# Patient Record
Sex: Female | Born: 1935 | Race: Black or African American | Hispanic: No | Marital: Married | Smoking: Never smoker
Health system: Southern US, Community
[De-identification: ages and names within clinical notes are randomized; demographics above are authoritative.]

## PROBLEM LIST (undated history)

## (undated) DIAGNOSIS — I10 Essential (primary) hypertension: Secondary | ICD-10-CM

## (undated) DIAGNOSIS — K5792 Diverticulitis of intestine, part unspecified, without perforation or abscess without bleeding: Secondary | ICD-10-CM

## (undated) DIAGNOSIS — C801 Malignant (primary) neoplasm, unspecified: Secondary | ICD-10-CM

## (undated) DIAGNOSIS — H409 Unspecified glaucoma: Secondary | ICD-10-CM

## (undated) DIAGNOSIS — M810 Age-related osteoporosis without current pathological fracture: Secondary | ICD-10-CM

## (undated) DIAGNOSIS — M1990 Unspecified osteoarthritis, unspecified site: Secondary | ICD-10-CM

## (undated) DIAGNOSIS — H25019 Cortical age-related cataract, unspecified eye: Secondary | ICD-10-CM

## (undated) DIAGNOSIS — T7840XA Allergy, unspecified, initial encounter: Secondary | ICD-10-CM

## (undated) HISTORY — PX: WRIST SURGERY: SHX841

## (undated) HISTORY — PX: BREAST BIOPSY: SHX20

## (undated) HISTORY — PX: BLADDER SURGERY: SHX569

## (undated) HISTORY — PX: ABDOMINAL HYSTERECTOMY: SHX81

---

## 2016-11-20 ENCOUNTER — Other Ambulatory Visit: Payer: Self-pay

## 2016-11-20 ENCOUNTER — Emergency Department
Admission: EM | Admit: 2016-11-20 | Discharge: 2016-11-20 | Disposition: A | Discharge status: Home / Self Care | Payer: Medicare Other | Attending: Emergency Medicine | Admitting: Emergency Medicine

## 2016-11-20 ENCOUNTER — Emergency Department: Payer: Medicare Other

## 2016-11-20 DIAGNOSIS — R531 Weakness: Secondary | ICD-10-CM | POA: Insufficient documentation

## 2016-11-20 DIAGNOSIS — I10 Essential (primary) hypertension: Secondary | ICD-10-CM | POA: Diagnosis not present

## 2016-11-20 DIAGNOSIS — R200 Anesthesia of skin: Secondary | ICD-10-CM | POA: Diagnosis not present

## 2016-11-20 DIAGNOSIS — N390 Urinary tract infection, site not specified: Secondary | ICD-10-CM | POA: Diagnosis not present

## 2016-11-20 DIAGNOSIS — R251 Tremor, unspecified: Secondary | ICD-10-CM | POA: Diagnosis not present

## 2016-11-20 DIAGNOSIS — Z79899 Other long term (current) drug therapy: Secondary | ICD-10-CM | POA: Diagnosis not present

## 2016-11-20 HISTORY — DX: Essential (primary) hypertension: I10

## 2016-11-20 HISTORY — DX: Unspecified osteoarthritis, unspecified site: M19.90

## 2016-11-20 HISTORY — DX: Diverticulitis of intestine, part unspecified, without perforation or abscess without bleeding: K57.92

## 2016-11-20 HISTORY — DX: Unspecified glaucoma: H40.9

## 2016-11-20 LAB — CBC
HCT: 41.8 % (ref 35.0–47.0)
HEMOGLOBIN: 13.9 g/dL (ref 12.0–16.0)
MCH: 29.9 pg (ref 26.0–34.0)
MCHC: 33.2 g/dL (ref 32.0–36.0)
MCV: 90.2 fL (ref 80.0–100.0)
Platelets: 185 10*3/uL (ref 150–440)
RBC: 4.64 MIL/uL (ref 3.80–5.20)
RDW: 14.3 % (ref 11.5–14.5)
WBC: 6.4 10*3/uL (ref 3.6–11.0)

## 2016-11-20 LAB — COMPREHENSIVE METABOLIC PANEL
ALBUMIN: 3.8 g/dL (ref 3.5–5.0)
ALT: 13 U/L — AB (ref 14–54)
ANION GAP: 10 (ref 5–15)
AST: 21 U/L (ref 15–41)
Alkaline Phosphatase: 44 U/L (ref 38–126)
BUN: 19 mg/dL (ref 6–20)
CHLORIDE: 102 mmol/L (ref 101–111)
CO2: 27 mmol/L (ref 22–32)
Calcium: 9.5 mg/dL (ref 8.9–10.3)
Creatinine, Ser: 0.76 mg/dL (ref 0.44–1.00)
GFR calc Af Amer: >60 mL/min (ref >60)
GFR calc non Af Amer: >60 mL/min (ref >60)
GLUCOSE: 99 mg/dL (ref 65–99)
Potassium: 3.6 mmol/L (ref 3.5–5.1)
SODIUM: 139 mmol/L (ref 135–145)
Total Bilirubin: 0.4 mg/dL (ref 0.3–1.2)
Total Protein: 7.5 g/dL (ref 6.5–8.1)

## 2016-11-20 LAB — URINALYSIS, COMPLETE (UACMP) WITH MICROSCOPIC
BACTERIA UA: NONE SEEN
Bilirubin Urine: NEGATIVE
Glucose, UA: NEGATIVE mg/dL
KETONES UR: NEGATIVE mg/dL
Nitrite: NEGATIVE
PROTEIN: NEGATIVE mg/dL
Specific Gravity, Urine: 1.005 (ref 1.005–1.030)
pH: 6 (ref 5.0–8.0)

## 2016-11-20 LAB — MAGNESIUM: MAGNESIUM: 1.9 mg/dL (ref 1.7–2.4)

## 2016-11-20 LAB — TROPONIN I: Troponin I: <0.03 ng/mL (ref <0.03)

## 2016-11-20 IMAGING — CT CT HEAD W/O CM
3 series · 14 of 44 positions shown, 16 images · non-contrast
Comparison: None.

CLINICAL DATA: Acute onset of left leg tremor, progressing to the
left arm.

EXAM:
CT HEAD WITHOUT CONTRAST
TECHNIQUE: Contiguous axial images were obtained from the base of the skull
through the vertex without intravenous contrast.

[Series 3: ax head wo · axial · 0.32mm/px · z∈[-87,+19]mm · 8 of 27 slices shown, 10 images]
[im 3/27  brain]
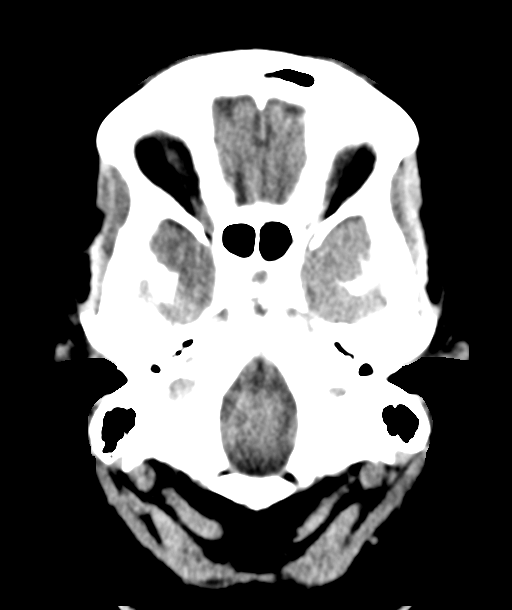
[im 3/27  bone]
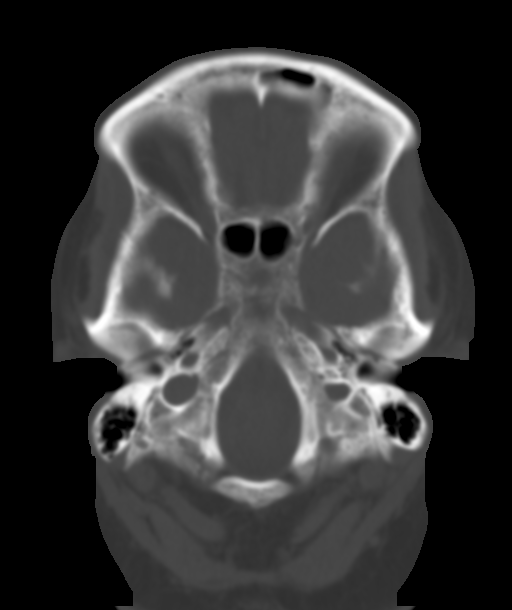
[im 6/27  brain]
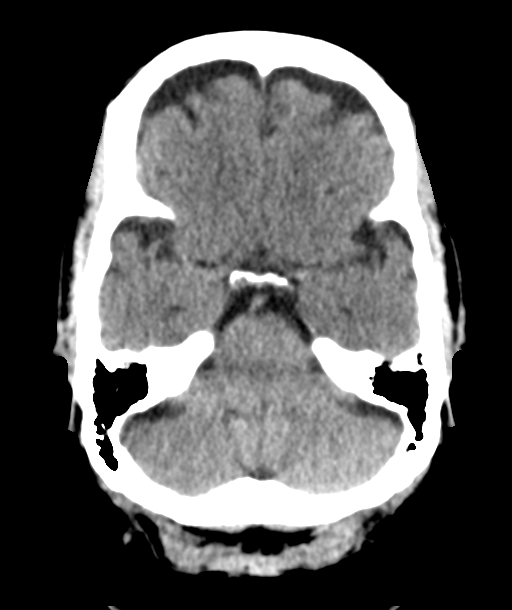
[im 9/27  brain]
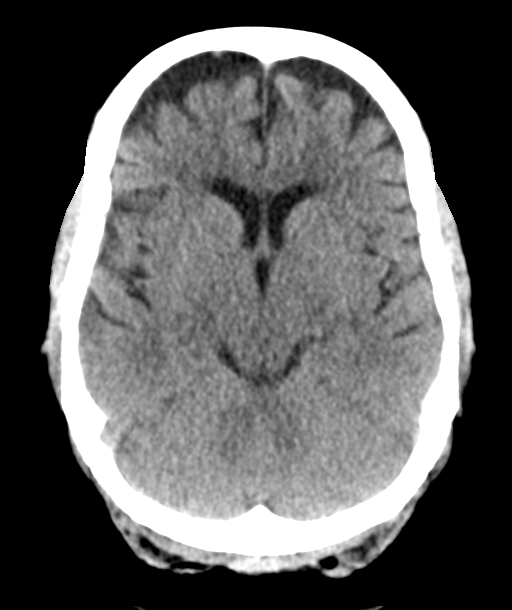
[im 12/27  brain]
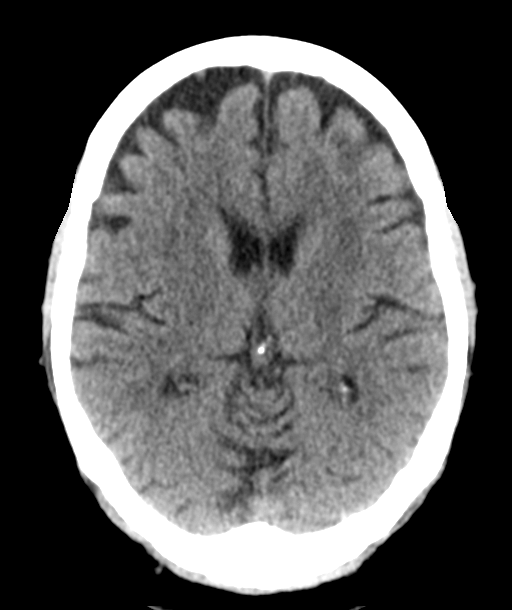
[im 16/27  brain]
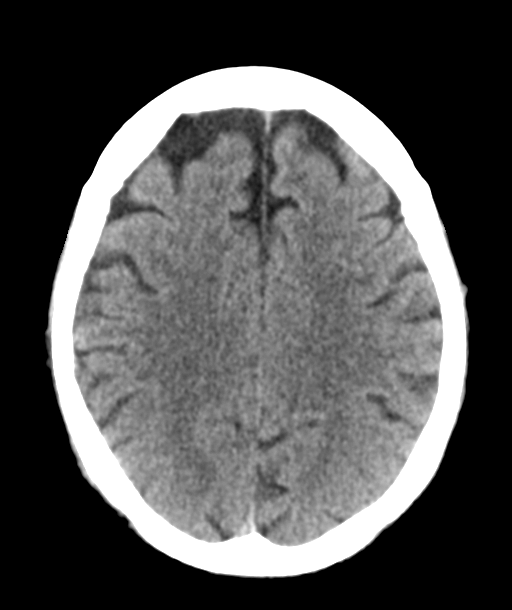
[im 16/27  bone]
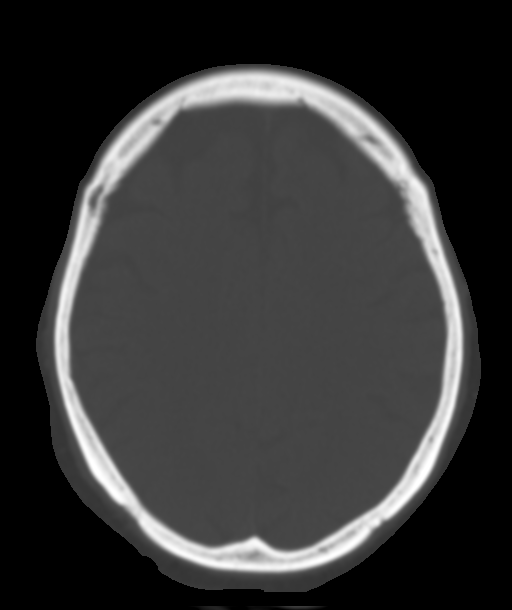
[im 19/27  brain]
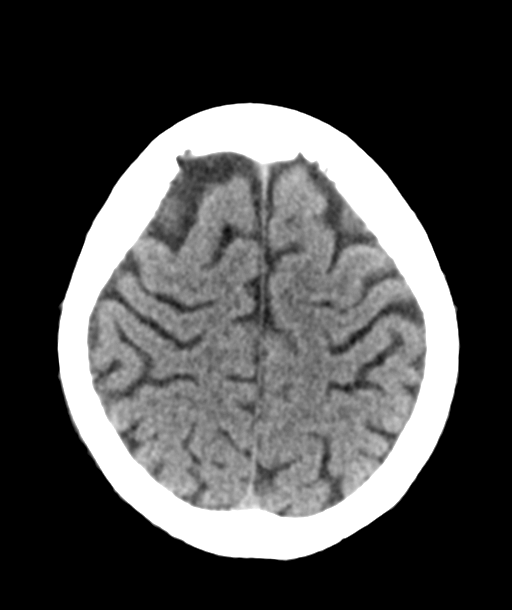
[im 22/27  brain]
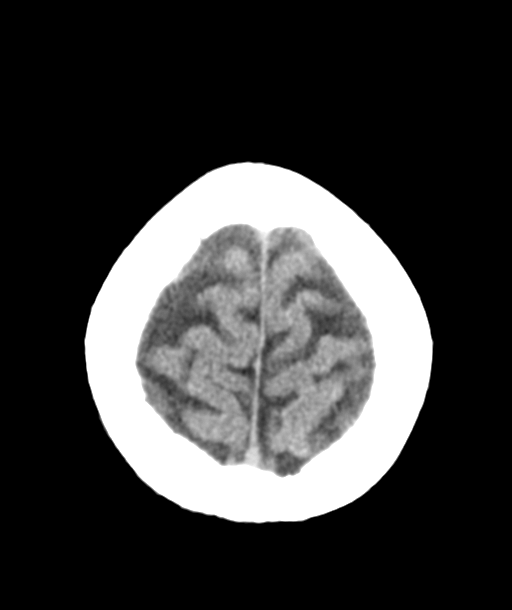
[im 25/27  brain]
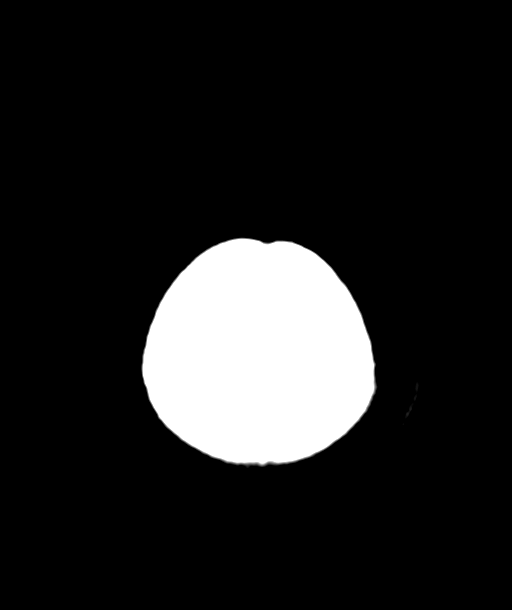

[Series 5: coronal soft tissue · coronal · 0.29mm/px · 3 of 62 slices shown]
[im 21/62  brain]
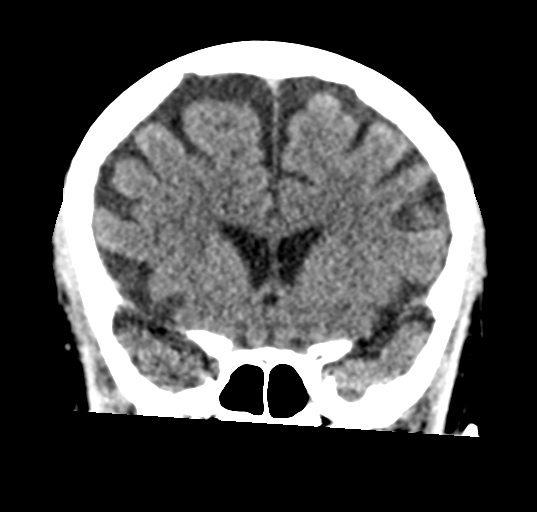
[im 28/62  brain]
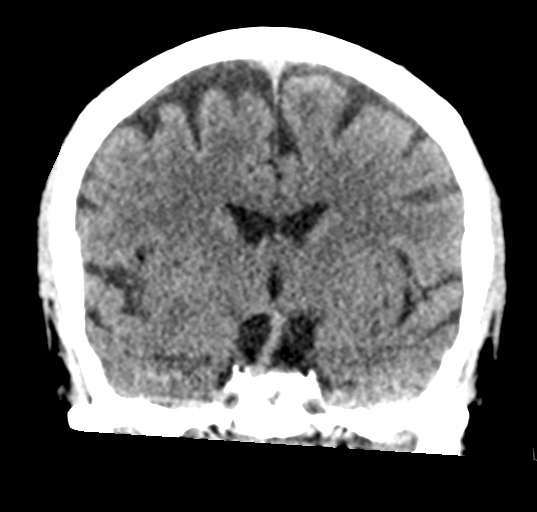
[im 34/62  brain]
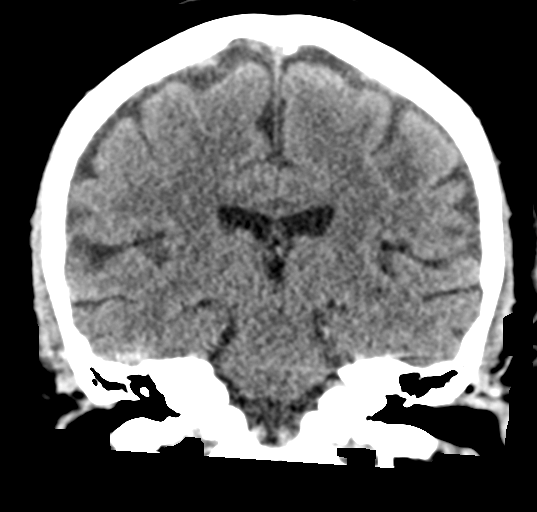

[Series 6: sagittal soft tissue · sagittal · 0.29mm/px · 3 of 49 slices shown]
[im 17/49  brain]
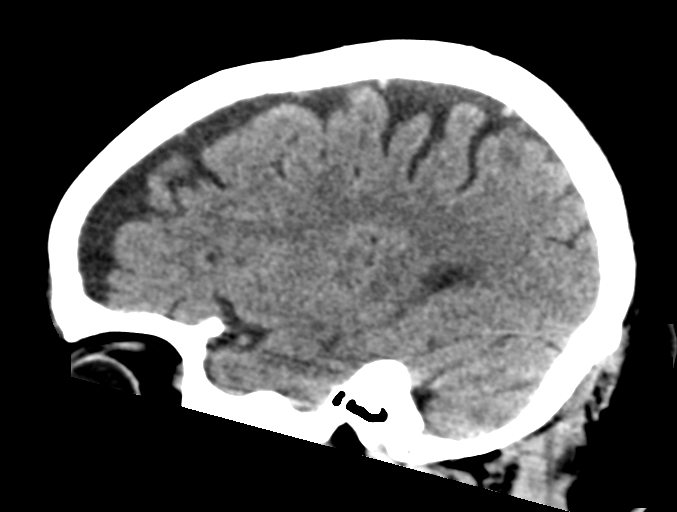
[im 25/49  brain]
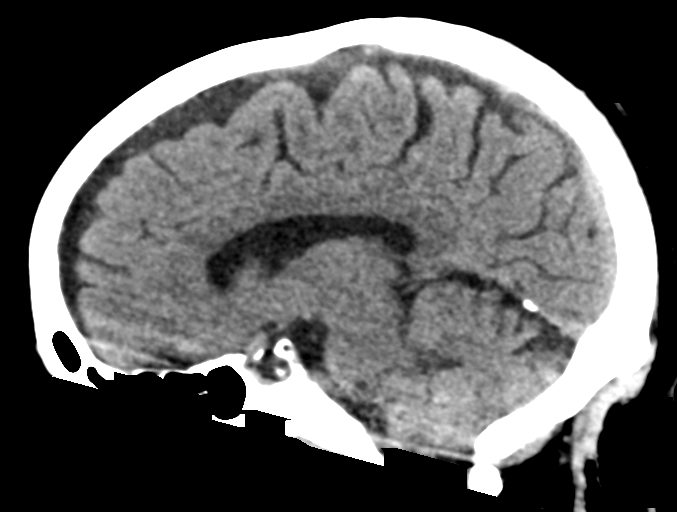
[im 33/49  brain]
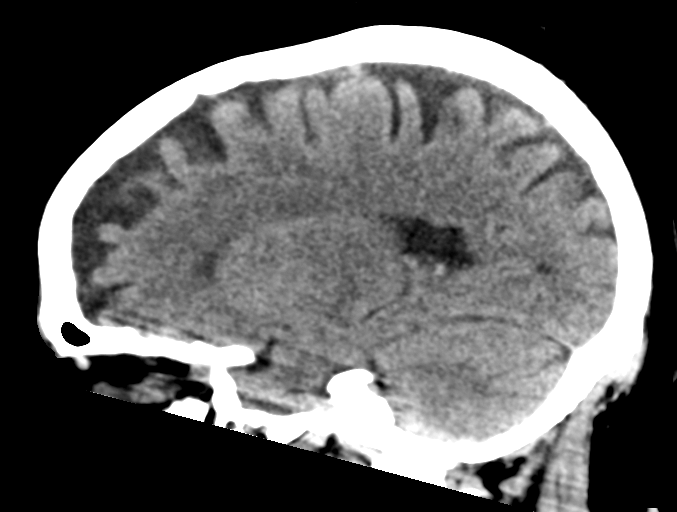

[14 of 44 positions shown; findings below may reference images not displayed]

FINDINGS: Brain: No evidence of acute infarction, hemorrhage, hydrocephalus,
extra-axial collection or mass lesion/mass effect.

Prominence of the sulci suggests mild cortical volume loss. Mild
periventricular white matter change likely reflects small vessel
ischemic microangiopathy.

The brainstem and fourth ventricle are within normal limits. The
basal ganglia are unremarkable in appearance. The cerebral
hemispheres demonstrate grossly normal gray-white differentiation.
No mass effect or midline shift is seen.

Vascular: No hyperdense vessel or unexpected calcification.

Skull: There is no evidence of fracture; visualized osseous
structures are unremarkable in appearance.

Sinuses/Orbits: The visualized portions of the orbits are within
normal limits. The paranasal sinuses and mastoid air cells are
well-aerated.

Other: No significant soft tissue abnormalities are seen.
IMPRESSION: 1. No acute intracranial pathology seen on CT.
2. Mild cortical volume loss and scattered small vessel ischemic
microangiopathy.

## 2016-11-20 MED ORDER — CEPHALEXIN 500 MG PO CAPS: 500.0000 mg | ORAL_CAPSULE | Freq: Two times a day (BID) | ORAL | 0 refills | Status: DC

## 2016-11-20 NOTE — ED Provider Notes (Signed)
[facility] Emergency Department Provider Note   ____________________________________________   First MD Initiated Contact with Patient 11/20/16 0211     (approximate)  I have reviewed the triage vital signs and the nursing notes.   HISTORY  Chief Complaint Tremors    HPI Mary Mckay is a 82 y.o. female who presents to the ED from home with a chief complaint of tremors.  Patient has a history of hypertension and diverticulitis who was in her baseline state of health approximately 10:30 PM when she experienced tremors starting in her left leg and subsequently her left arm.  Describes her tremors as nonpainful, felt like "nerve pain", and resolved when she started to move her left arm and left leg.  Describes extremity weakness, numbness/tingling, slurred speech, confusion, vision changes. Denies recent fever, chills, chest pain, shortness of breath, abdominal pain, nausea, vomiting.  Denies recent travel or trauma.   Past Medical History:  Diagnosis Date  . Arthritis   . Diverticulitis   . Glaucoma   . Hypertension     There are no active problems to display for this patient.   Past Surgical History:  Procedure Laterality Date  . ABDOMINAL HYSTERECTOMY    . BLADDER SURGERY      Prior to Admission medications   Medication Sig Start Date End Date Taking? Authorizing Provider  atenolol (TENORMIN) 100 MG tablet Take 100 mg daily by mouth.   Yes [provider]  candesartan (ATACAND) 32 MG tablet Take 32 mg daily by mouth.   Yes [provider]  fluticasone (VERAMYST) 27.5 MCG/SPRAY nasal spray Place 2 sprays daily into the nose.   Yes [provider]  hydrochlorothiazide (HYDRODIURIL) 25 MG tablet Take 25 mg daily by mouth.   Yes [provider]  linagliptin (TRADJENTA) 5 MG TABS tablet Take 5 mg daily by mouth.   Yes [provider]  NIFEdipine (PROCARDIA-XL/ADALAT CC) 30 MG 24 hr tablet Take 30 mg  daily by mouth.   Yes [provider]  traMADol (ULTRAM) 50 MG tablet Take 50 mg every 8 (eight) hours as needed by mouth.   Yes [provider]    Allergies Fentanyl  No family history on file.  Social History Social History   Tobacco Use  . Smoking status: Never Smoker  . Smokeless tobacco: Never Used  Substance Use Topics  . Alcohol use: No    Frequency: Never  . Drug use: Not on file    Review of Systems  Constitutional: No fever/chills. Eyes: No visual changes. ENT: No sore throat. Cardiovascular: Denies chest pain. Respiratory: Denies shortness of breath. Gastrointestinal: No abdominal pain.  No nausea, no vomiting.  No diarrhea.  No constipation. Genitourinary: Negative for dysuria. Musculoskeletal: Negative for back pain. Skin: Negative for rash. Neurological: Positive for left leg and left arm tremors.  Negative for headaches, focal weakness or numbness.   ____________________________________________   PHYSICAL EXAM:  VITAL SIGNS: ED Triage Vitals  Enc Vitals Group     BP 11/20/16 0138 (!) 150/64     Pulse Rate 11/20/16 0138 65     Resp 11/20/16 0138 17     Temp 11/20/16 0138 98.1 F (36.7 C)     Temp Source 11/20/16 0138 Oral     SpO2 11/20/16 0138 94 %     Weight 11/20/16 0127 203 lb (92.1 kg)     Height 11/20/16 0127 5\' 1"  (1.549 m)     Head Circumference --  Peak Flow --      Pain Score --      Pain Loc --      Pain Edu? --      Excl. in [place]? --      Constitutional: Alert and oriented. Well appearing and in no acute distress. Eyes: Conjunctivae are normal. PERRL. EOMI. Head: Atraumatic. Nose: No congestion/rhinnorhea. Mouth/Throat: Mucous membranes are moist.  Oropharynx non-erythematous. Neck: No stridor.  No carotid bruits.  Cardiovascular: Normal rate, regular rhythm. Grossly normal heart sounds.  Good peripheral circulation. Respiratory: Normal respiratory effort.  No retractions. Lungs CTAB. Gastrointestinal:  Soft and nontender. No distention. No abdominal bruits. No CVA tenderness. Musculoskeletal: No lower extremity tenderness nor edema.  No joint effusions. Neurologic:  Normal speech and language. No gross focal neurologic deficits are appreciated. MAEx4. No tremors. No pronator drift. Skin:  Skin is warm, dry and intact. No rash noted. Psychiatric: Mood and affect are normal. Speech and behavior are normal.  ____________________________________________   LABS (all labs ordered are listed, but only abnormal results are displayed)  Labs Reviewed  COMPREHENSIVE METABOLIC PANEL - Abnormal; Notable for the following components:      Result Value   ALT 13 (*)    All other components within normal limits  URINALYSIS, COMPLETE (UACMP) WITH MICROSCOPIC - Abnormal; Notable for the following components:   Color, Urine STRAW (*)    APPearance CLEAR (*)    Hgb urine dipstick SMALL (*)    Leukocytes, UA MODERATE (*)    Squamous Epithelial / LPF 0-5 (*)    All other components within normal limits  CBC  TROPONIN I  MAGNESIUM   ____________________________________________  EKG  ED ECG REPORT I, Danayah Smyre J, the attending physician, personally viewed and interpreted this ECG.   Date: 11/20/2016  EKG Time: 0226  Rate: 59  Rhythm: normal EKG, normal sinus rhythm  Axis: RAD  Intervals:none  ST&T Change: Nonspecific  ____________________________________________  RADIOLOGY  Ct Head Wo Contrast  Result Date: 11/20/2016 CLINICAL DATA:  Acute onset of left leg tremor, progressing to the left arm. EXAM: CT HEAD WITHOUT CONTRAST TECHNIQUE: Contiguous axial images were obtained from the base of the skull through the vertex without intravenous contrast. COMPARISON:  None. FINDINGS: Brain: No evidence of acute infarction, hemorrhage, hydrocephalus, extra-axial collection or mass lesion/mass effect. Prominence of the sulci suggests mild cortical volume loss. Mild periventricular white matter change  likely reflects small vessel ischemic microangiopathy. The brainstem and fourth ventricle are within normal limits. The basal ganglia are unremarkable in appearance. The cerebral hemispheres demonstrate grossly normal gray-white differentiation. No mass effect or midline shift is seen. Vascular: No hyperdense vessel or unexpected calcification. Skull: There is no evidence of fracture; visualized osseous structures are unremarkable in appearance. Sinuses/Orbits: The visualized portions of the orbits are within normal limits. The paranasal sinuses and mastoid air cells are well-aerated. Other: No significant soft tissue abnormalities are seen. IMPRESSION: 1. No acute intracranial pathology seen on CT. 2. Mild cortical volume loss and scattered small vessel ischemic microangiopathy. Electronically Signed   By: Garald Balding M.D.   On: 11/20/2016 03:01    ____________________________________________   PROCEDURES  Procedure(s) performed: None  Procedures  Critical Care performed: No  ____________________________________________   INITIAL IMPRESSION / ASSESSMENT AND PLAN / ED COURSE  As part of my medical decision making, I reviewed the following data within the [place] History obtained from family, Nursing notes reviewed and incorporated, Labs reviewed, EKG interpreted, Radiograph reviewed and  Notes from prior ED visits.   82 year old female with hypertension who presents with tremors of her left leg and left arm. Symptoms resolved by the time of this interview and examination. Differential diagnosis includes but is not limited to neurological, metabolic, cardiac causes.  Will check screening lab work, EKG, urinalysis, CT head and reassess.  Clinical Course as of Nov 21 330  Sat Nov 20, 2016  4098 Patient resting in no acute distress.  She has not had a recurrence of her tremors.  Updated her of laboratory and imaging results.  Will start antibiotic and patient will follow  up with her PCP closely early next week.  Strict return precautions given.  Patient verbalizes understanding and agrees with plan of care.  [JS]    Clinical Course User Index [JS] Paulette Blanch, MD     ____________________________________________   FINAL CLINICAL IMPRESSION(S) / ED DIAGNOSES  Final diagnoses:  Tremor  Urinary tract infection without hematuria, site unspecified     ED Discharge Orders    None       Note:  This document was prepared using Dragon voice recognition software and may include unintentional dictation errors.    Paulette Blanch, MD 11/20/16 [phone]

## 2016-11-20 NOTE — ED Triage Notes (Signed)
Patient c/o tremor beginning in left leg that has progressed left arm. Patient denies any pain/discomfort. Patient reports symptoms began at approx 2230 last night.

## 2016-11-20 NOTE — ED Notes (Signed)
Pt reports she was in bed around 2230 when she started to have uncontrolled movements on her left leg and travel up her left arm. At present pt not experiencing any symptoms reports when she walked and moved felt better. Denies any pain, talks in complete sentences.

## 2016-11-20 NOTE — Discharge Instructions (Signed)
1.  Take antibiotic as prescribed (Keflex 500 mg twice daily x 7 days). 2.  Return to the ER for worsening symptoms, persistent vomiting, difficulty breathing or other concerns.

## 2017-03-24 ENCOUNTER — Encounter: Payer: Self-pay | Admitting: *Deleted

## 2017-03-25 ENCOUNTER — Ambulatory Visit: Payer: Medicare Other | Admitting: Certified Registered"

## 2017-03-25 ENCOUNTER — Encounter
Admission: RE | Disposition: A | Discharge status: Home / Self Care | Payer: Self-pay | Source: Ambulatory Visit | Attending: Gastroenterology

## 2017-03-25 ENCOUNTER — Ambulatory Visit
Admission: RE | Admit: 2017-03-25 | Discharge: 2017-03-25 | Disposition: A | Discharge status: Home / Self Care | Payer: Medicare Other | Source: Ambulatory Visit | Attending: Gastroenterology | Admitting: Gastroenterology

## 2017-03-25 ENCOUNTER — Encounter: Payer: Self-pay | Admitting: Anesthesiology

## 2017-03-25 DIAGNOSIS — Z79891 Long term (current) use of opiate analgesic: Secondary | ICD-10-CM | POA: Diagnosis not present

## 2017-03-25 DIAGNOSIS — Z86010 Personal history of colonic polyps: Secondary | ICD-10-CM | POA: Insufficient documentation

## 2017-03-25 DIAGNOSIS — M810 Age-related osteoporosis without current pathological fracture: Secondary | ICD-10-CM | POA: Diagnosis not present

## 2017-03-25 DIAGNOSIS — I10 Essential (primary) hypertension: Secondary | ICD-10-CM | POA: Diagnosis not present

## 2017-03-25 DIAGNOSIS — H409 Unspecified glaucoma: Secondary | ICD-10-CM | POA: Insufficient documentation

## 2017-03-25 DIAGNOSIS — Z7984 Long term (current) use of oral hypoglycemic drugs: Secondary | ICD-10-CM | POA: Insufficient documentation

## 2017-03-25 DIAGNOSIS — K5730 Diverticulosis of large intestine without perforation or abscess without bleeding: Secondary | ICD-10-CM | POA: Insufficient documentation

## 2017-03-25 DIAGNOSIS — Z7951 Long term (current) use of inhaled steroids: Secondary | ICD-10-CM | POA: Insufficient documentation

## 2017-03-25 DIAGNOSIS — Z79899 Other long term (current) drug therapy: Secondary | ICD-10-CM | POA: Insufficient documentation

## 2017-03-25 DIAGNOSIS — D123 Benign neoplasm of transverse colon: Secondary | ICD-10-CM | POA: Insufficient documentation

## 2017-03-25 DIAGNOSIS — Z6841 Body Mass Index (BMI) 40.0 and over, adult: Secondary | ICD-10-CM | POA: Insufficient documentation

## 2017-03-25 DIAGNOSIS — Z1211 Encounter for screening for malignant neoplasm of colon: Secondary | ICD-10-CM | POA: Insufficient documentation

## 2017-03-25 DIAGNOSIS — K640 First degree hemorrhoids: Secondary | ICD-10-CM | POA: Insufficient documentation

## 2017-03-25 HISTORY — DX: Cortical age-related cataract, unspecified eye: H25.019

## 2017-03-25 HISTORY — PX: COLONOSCOPY WITH PROPOFOL: SHX5780

## 2017-03-25 HISTORY — DX: Age-related osteoporosis without current pathological fracture: M81.0

## 2017-03-25 HISTORY — DX: Allergy, unspecified, initial encounter: T78.40XA

## 2017-03-25 SURGERY — COLONOSCOPY WITH PROPOFOL
Anesthesia: General

## 2017-03-25 MED ORDER — GLYCOPYRROLATE 0.2 MG/ML IJ SOLN
INTRAMUSCULAR | Status: AC
  Filled 2017-03-25: qty 1

## 2017-03-25 MED ORDER — PROPOFOL 10 MG/ML IV BOLUS
INTRAVENOUS | Status: AC
  Filled 2017-03-25: qty 20

## 2017-03-25 MED ORDER — LIDOCAINE HCL (PF) 1 % IJ SOLN
2.0000 mL | Freq: Once | INTRAMUSCULAR | Status: AC
  Administered 2017-03-25: 0.3 mL via INTRADERMAL

## 2017-03-25 MED ORDER — PROPOFOL 10 MG/ML IV BOLUS
INTRAVENOUS | Status: DC | PRN
  Administered 2017-03-25: 50 mg via INTRAVENOUS
  Administered 2017-03-25: 100 mg via INTRAVENOUS

## 2017-03-25 MED ORDER — GLYCOPYRROLATE 0.2 MG/ML IJ SOLN
INTRAMUSCULAR | Status: DC | PRN
  Administered 2017-03-25: 0.1 mg via INTRAVENOUS

## 2017-03-25 MED ORDER — PROPOFOL 500 MG/50ML IV EMUL
INTRAVENOUS | Status: AC
  Filled 2017-03-25: qty 50

## 2017-03-25 MED ORDER — SODIUM CHLORIDE 0.9 % IV SOLN
INTRAVENOUS | Status: DC
  Administered 2017-03-25: 11:00:00 via INTRAVENOUS

## 2017-03-25 MED ORDER — LIDOCAINE HCL (PF) 1 % IJ SOLN
INTRAMUSCULAR | Status: AC
  Administered 2017-03-25: 0.3 mL via INTRADERMAL
  Filled 2017-03-25: qty 2

## 2017-03-25 MED ORDER — PROPOFOL 500 MG/50ML IV EMUL
INTRAVENOUS | Status: DC | PRN
  Administered 2017-03-25: 125 ug/kg/min via INTRAVENOUS

## 2017-03-25 MED ORDER — SODIUM CHLORIDE 0.9 % IV SOLN: INTRAVENOUS | Status: DC

## 2017-03-25 MED ORDER — LIDOCAINE HCL (PF) 2 % IJ SOLN
INTRAMUSCULAR | Status: AC
  Filled 2017-03-25: qty 10

## 2017-03-25 MED ORDER — LIDOCAINE HCL (CARDIAC) 20 MG/ML IV SOLN
INTRAVENOUS | Status: DC | PRN
  Administered 2017-03-25: 50 mg via INTRATRACHEAL

## 2017-03-25 NOTE — Anesthesia Postprocedure Evaluation (Signed)
Anesthesia Post Note  Patient: Mary Mckay  Procedure(s) Performed: COLONOSCOPY WITH PROPOFOL (N/A )  Patient location during evaluation: Endoscopy Anesthesia Type: General Level of consciousness: awake and alert, oriented and patient cooperative Pain management: satisfactory to patient Vital Signs Assessment: post-procedure vital signs reviewed and stable Respiratory status: spontaneous breathing and respiratory function stable Cardiovascular status: blood pressure returned to baseline and stable Postop Assessment: no headache, no backache, patient able to bend at knees, no apparent nausea or vomiting and adequate PO intake Anesthetic complications: no     Last Vitals:  Vitals Value Taken Time  BP 147/73 03/25/2017 12:55 PM  Temp 36.2 C 03/25/2017 12:54 PM  Pulse 70 03/25/2017 12:57 PM  Resp 14 03/25/2017 12:57 PM  SpO2 98 % 03/25/2017 12:57 PM  Vitals shown include unvalidated device data.  Last Pain:  Vitals:   03/25/17 1254  TempSrc: Tympanic  PainSc: 0-No pain                 [name] H Bobbie Virden

## 2017-03-25 NOTE — Anesthesia Post-op Follow-up Note (Signed)
Anesthesia QCDR form completed.        

## 2017-03-25 NOTE — Anesthesia Preprocedure Evaluation (Signed)
Anesthesia Evaluation  Patient identified by MRN, date of birth, ID band Patient awake    Reviewed: Allergy & Precautions, H&P , NPO status , Patient's Chart, lab work & pertinent test results, reviewed documented beta blocker date and time   History of Anesthesia Complications (+) PONV and history of anesthetic complications  Airway Mallampati: II  TM Distance: >3 FB Neck ROM: full    Dental  (+) Dental Advidsory Given, Missing, Teeth Intact   Pulmonary neg pulmonary ROS,           Cardiovascular Exercise Tolerance: Good hypertension, (-) angina(-) CAD, (-) Past MI, (-) Cardiac Stents and (-) CABG (-) dysrhythmias (-) Valvular Problems/Murmurs     Neuro/Psych negative neurological ROS  negative psych ROS   GI/Hepatic negative GI ROS, Neg liver ROS,   Endo/Other  neg diabetesMorbid obesity  Renal/GU negative Renal ROS  negative genitourinary   Musculoskeletal   Abdominal   Peds  Hematology negative hematology ROS (+)   Anesthesia Other Findings Past Medical History: No date: Allergic state No date: Arthritis No date: Cataract cortical, senile No date: Diverticulitis No date: Glaucoma No date: Hypertension No date: Osteoporosis   Reproductive/Obstetrics negative OB ROS                             Anesthesia Physical Anesthesia Plan  ASA: III  Anesthesia Plan: General   Post-op Pain Management:    Induction: Intravenous  PONV Risk Score and Plan: 4 or greater and Propofol infusion  Airway Management Planned: Natural Airway and Nasal Cannula  Additional Equipment:   Intra-op Plan:   Post-operative Plan:   Informed Consent: I have reviewed the patients History and Physical, chart, labs and discussed the procedure including the risks, benefits and alternatives for the proposed anesthesia with the patient or authorized representative who has indicated his/her understanding  and acceptance.   Dental Advisory Given  Plan Discussed with: Anesthesiologist, CRNA and Surgeon  Anesthesia Plan Comments:         Anesthesia Quick Evaluation

## 2017-03-25 NOTE — H&P (Signed)
Outpatient short stay form Pre-procedure 03/25/2017 12:01 PM Mary Sails MD  Primary Physician: Dr. [place] Pink  Reason for visit: Colonoscopy  History of present illness: Patient is a 83 year old female presenting today as above.  She has personal history of a large colon polyp having been removed 2014 from the region of the hepatic flexure.  This was tattooed at that time.  She had a follow-up colonoscopy in 2015 that indicated no regrowth at that point.  Is presenting today for a repeat check.  She tolerated prep well.  She takes no aspirin or blood thinning agent.    Current Facility-Administered Medications:  .  0.9 %  sodium chloride infusion, , Intravenous, Continuous, Mary Sails, MD .  0.9 %  sodium chloride infusion, , Intravenous, Continuous, Mary Sails, MD  Medications Prior to Admission  Medication Sig Dispense Refill Last Dose  . ammonium lactate (LAC-HYDRIN) 12 % lotion Apply 1 application topically as needed for dry skin.     . calcium acetate (PHOSLO) 667 MG capsule Take 60 mg by mouth daily.     . clotrimazole (LOTRIMIN) 1 % cream Apply 1 application topically 2 (two) times daily.     Marland Kitchen latanoprost (XALATAN) 0.005 % ophthalmic solution Place 1 drop into both eyes at bedtime.     . OMEGA 3-6-9 FATTY ACIDS PO Take 1,200 mg by mouth daily.     . potassium chloride (K-DUR,KLOR-CON) 10 MEQ tablet Take 10 mEq by mouth 2 (two) times daily.     . psyllium (METAMUCIL) 58.6 % packet Take 1 packet by mouth daily.     Marland Kitchen atenolol (TENORMIN) 100 MG tablet Take 100 mg daily by mouth.   03/25/2017 at 0815  . candesartan (ATACAND) 32 MG tablet Take 32 mg daily by mouth.     . cephALEXin (KEFLEX) 500 MG capsule Take 1 capsule (500 mg total) 2 (two) times daily by mouth. 14 capsule 0   . fluticasone (VERAMYST) 27.5 MCG/SPRAY nasal spray Place 2 sprays daily into the nose.     . hydrochlorothiazide (HYDRODIURIL) 25 MG tablet Take 25 mg daily by mouth.     .  linagliptin (TRADJENTA) 5 MG TABS tablet Take 5 mg daily by mouth.     Marland Kitchen NIFEdipine (PROCARDIA-XL/ADALAT CC) 30 MG 24 hr tablet Take 30 mg daily by mouth.   03/25/2017 at 0815  . traMADol (ULTRAM) 50 MG tablet Take 50 mg every 8 (eight) hours as needed by mouth.        Allergies  Allergen Reactions  . Fentanyl Nausea And Vomiting     Past Medical History:  Diagnosis Date  . Allergic state   . Arthritis   . Cataract cortical, senile   . Diverticulitis   . Glaucoma   . Hypertension   . Osteoporosis     Review of systems:      Physical Exam    Heart and lungs: Regular rate and rhythm without rub or gallop, lungs are bilaterally clear    HEENT: Normocephalic atraumatic eyes are anicteric    Other:    Pertinant exam for procedure: Soft nontender nondistended bowel sounds positive normoactive.    Planned proceedures: Colonoscopy and indicated procedures. I have discussed the risks benefits and complications of procedures to include not limited to bleeding, infection, perforation and the risk of sedation and the patient wishes to proceed.    Mary Sails, MD Gastroenterology 03/25/2017  12:01 PM

## 2017-03-25 NOTE — Op Note (Signed)
[facility] Gastroenterology Patient Name: Mary Mckay Procedure Date: 03/25/2017 11:53 AM MRN: [id number] Account #: [id number] Date of Birth: Dec 31, 1934 Admit Type: Outpatient Age: 83 Room: [facility] ENDO ROOM 3 Gender: Female Note Status: Finalized Procedure:            Colonoscopy Indications:          Personal history of colonic polyps Providers:            [name], MD Referring MD:         Irven Easterly. Kary Kos, MD (Referring MD) Medicines:            Monitored Anesthesia Care Complications:        No immediate complications. Procedure:            Pre-Anesthesia Assessment:                       - ASA Grade Assessment: III - A patient with severe                        systemic disease.                       After obtaining informed consent, the colonoscope was                        passed under direct vision. Throughout the procedure,                        the patient's blood pressure, pulse, and oxygen                        saturations were monitored continuously. The Olympus                        PCF-H180AL colonoscope ( S#: [id number] ) was introduced                        through the anus and advanced to the the cecum,                        identified by appendiceal orifice and ileocecal valve.                        The colonoscopy was performed with moderate difficulty                        due to significant looping. Successful completion of                        the procedure was aided by changing the patient to a                        prone position. The patient tolerated the procedure                        well. The quality of the bowel preparation was fair. Findings:      Many small and large-mouthed diverticula were found in the sigmoid       colon, descending colon and transverse colon.      A tattoo was seen at the  hepatic flexure. A post-polypectomy scar was       found at the tattoo site. There was no evidence of residual polyp  tissue.      A 6 mm polyp was found in the proximal transverse colon. The polyp was       semi-pedunculated. The polyp was removed with a cold snare. Resection       and retrieval were complete. To prevent bleeding after the polypectomy,       one hemostatic clip was successfully placed (MR conditional). There was       no bleeding at the end of the maneuver.      A 3 mm polyp was found in the transverse colon. The polyp was sessile.       The polyp was removed with a cold snare. Resection and retrieval were       complete.      The retroflexed view of the distal rectum and anal verge was normal and       showed no anal or rectal abnormalities.      Non-bleeding internal hemorrhoids were found during anoscopy. The       hemorrhoids were small and Grade I (internal hemorrhoids that do not       prolapse). Impression:           - Preparation of the colon was fair.                       - Diverticulosis in the sigmoid colon, in the                        descending colon and in the transverse colon.                       - A tattoo was seen at the hepatic flexure. A                        post-polypectomy scar was found at the tattoo site.                        There was no evidence of residual polyp tissue.                       - One 6 mm polyp in the proximal transverse colon,                        removed with a cold snare. Resected and retrieved. Clip                        (MR conditional) was placed.                       - One 3 mm polyp in the transverse colon, removed with                        a cold snare. Resected and retrieved.                       - The distal rectum and anal verge are normal on                        retroflexion view.                       -  Non-bleeding internal hemorrhoids. Recommendation:       - Await pathology results.                       - Telephone GI clinic for pathology results in 1 week. Procedure Code(s):    --- Professional ---                        [phone], Colonoscopy, flexible; with removal of tumor(s),                        polyp(s), or other lesion(s) by snare technique Diagnosis Code(s):    --- Professional ---                       K64.0, First degree hemorrhoids                       D12.3, Benign neoplasm of transverse colon (hepatic                        flexure or splenic flexure)                       Z86.010, Personal history of colonic polyps                       K57.30, Diverticulosis of large intestine without                        perforation or abscess without bleeding CPT copyright 2016 [employer] Association. All rights reserved. The codes documented in this report are preliminary and upon coder review may  be revised to meet current compliance requirements. [name], MD 03/25/2017 12:54:22 PM This report has been signed electronically. Number of Addenda: 0 Note Initiated On: 03/25/2017 11:53 AM Scope Withdrawal Time: 0 hours 23 minutes 4 seconds  Total Procedure Duration: 0 hours 33 minutes 2 seconds       [facility]

## 2017-03-25 NOTE — Transfer of Care (Signed)
Immediate Anesthesia Transfer of Care Note  Patient: Mary Mckay  Procedure(s) Performed: COLONOSCOPY WITH PROPOFOL (N/A )  Patient Location: PACU  Anesthesia Type:General  Level of Consciousness: drowsy and patient cooperative  Airway & Oxygen Therapy: Patient Spontanous Breathing  Post-op Assessment: Report given to RN, Post -op Vital signs reviewed and stable and Patient moving all extremities  Post vital signs: Reviewed and stable  Last Vitals:  Vitals Value Taken Time  BP 147/73 03/25/2017 12:55 PM  Temp 36.2 C 03/25/2017 12:54 PM  Pulse 70 03/25/2017 12:56 PM  Resp 16 03/25/2017 12:56 PM  SpO2 99 % 03/25/2017 12:56 PM  Vitals shown include unvalidated device data.  Last Pain:  Vitals:   03/25/17 1254  TempSrc: Tympanic  PainSc:          Complications: No apparent anesthesia complications

## 2017-03-28 ENCOUNTER — Encounter: Payer: Self-pay | Admitting: Gastroenterology

## 2017-03-28 LAB — SURGICAL PATHOLOGY

## 2018-08-02 ENCOUNTER — Other Ambulatory Visit: Payer: Self-pay | Admitting: Family Medicine

## 2018-08-02 DIAGNOSIS — N644 Mastodynia: Secondary | ICD-10-CM

## 2018-08-09 ENCOUNTER — Ambulatory Visit
Admission: RE | Admit: 2018-08-09 | Discharge: 2018-08-09 | Disposition: A | Discharge status: Home / Self Care | Payer: Medicare Other | Source: Ambulatory Visit | Attending: Family Medicine | Admitting: Family Medicine

## 2018-08-09 DIAGNOSIS — N644 Mastodynia: Secondary | ICD-10-CM

## 2018-08-09 IMAGING — MG DIGITAL DIAGNOSTIC BILATERAL MAMMOGRAM WITH TOMO AND CAD
8 of 14 series · 8 of 40 positions shown · non-contrast
Comparison: Previous exam(s).

CLINICAL DATA: 83-year-old female complaining of pain and erythema
in the lateral and subareolar region of the right breast. Patient
has been treated with a 14 day course of amoxicillin/clavulanic. She
reports some improvement following antibiotic therapy but not
resolution.

EXAM:
DIGITAL DIAGNOSTIC BILATERAL MAMMOGRAM WITH CAD AND TOMO
ULTRASOUND LEFT BREAST

[L MLO synth-2D (1 of 2)]
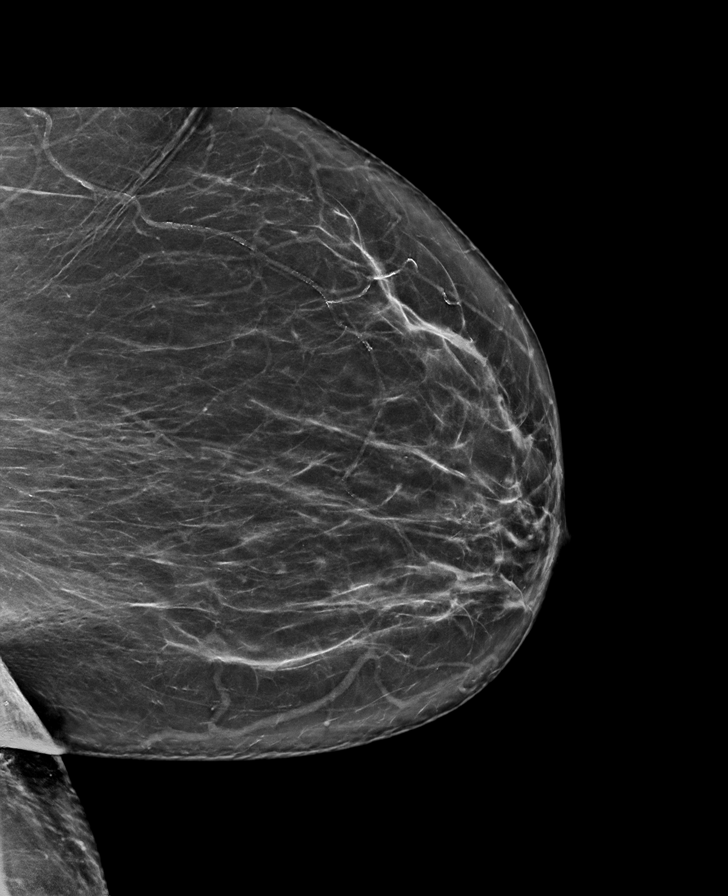

[R CC synth-2D]
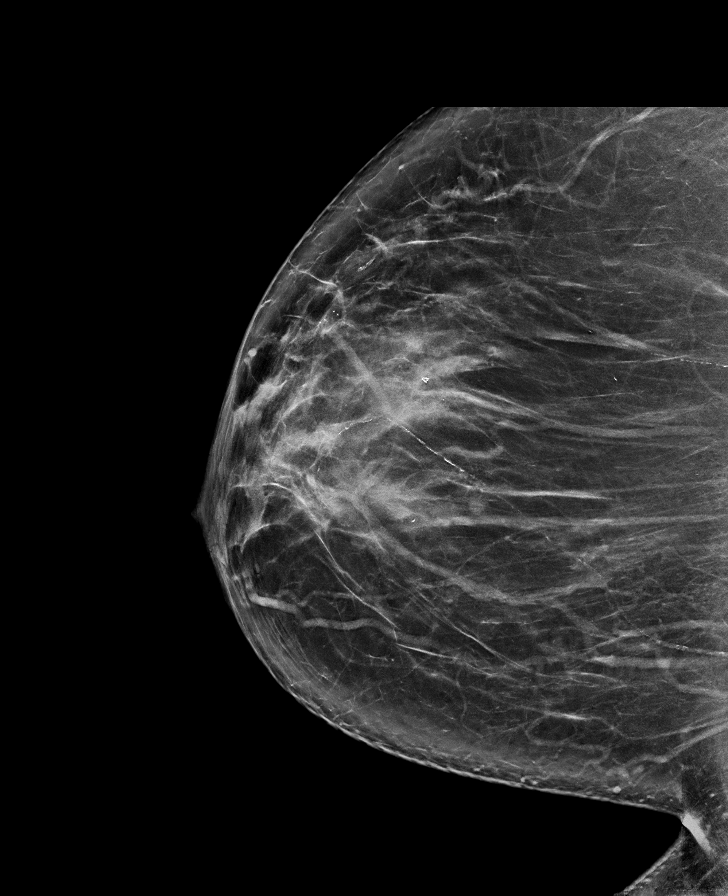

[L CC synth-2D]
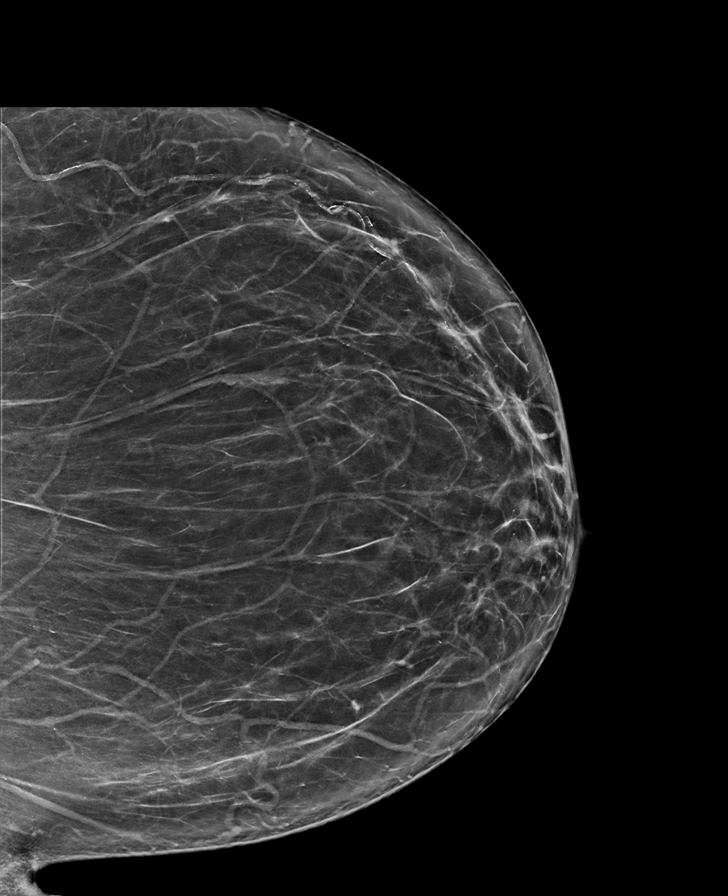

[R XCCL synth-2D]
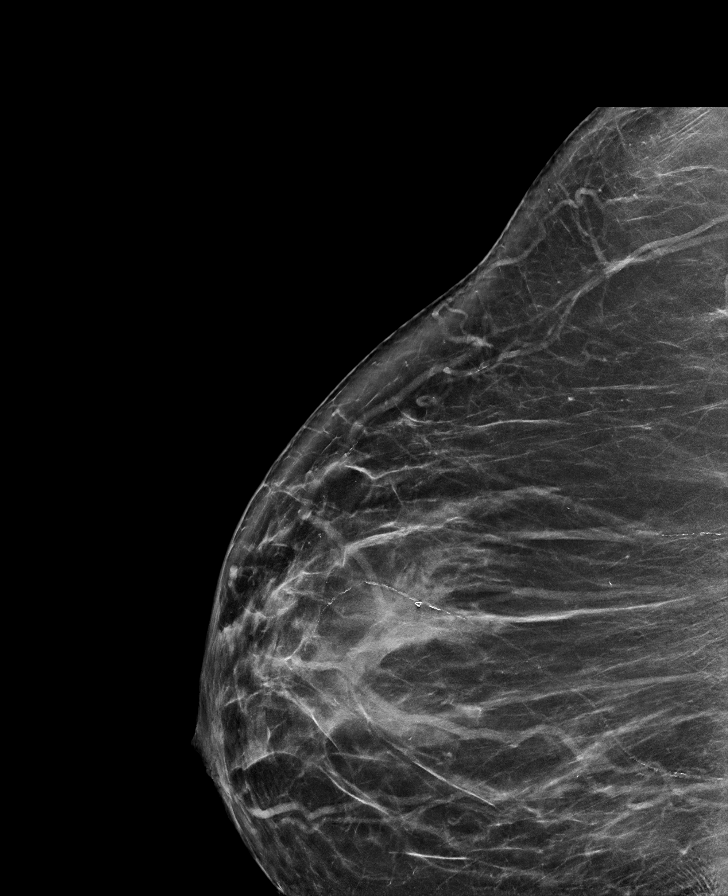

[R MLO synth-2D (1 of 2)]
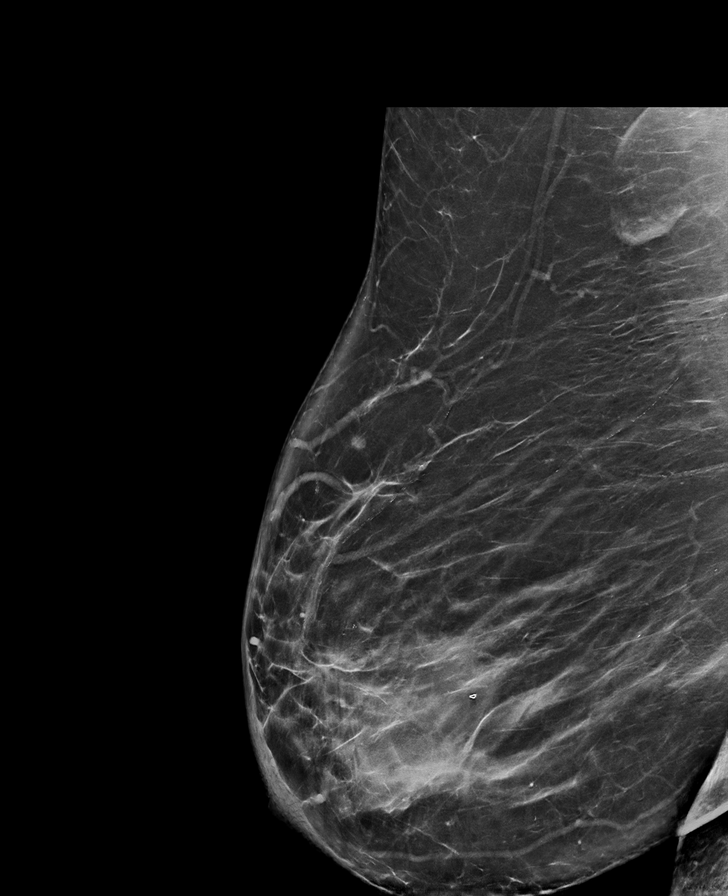

[R MLO synth-2D (2 of 2)]
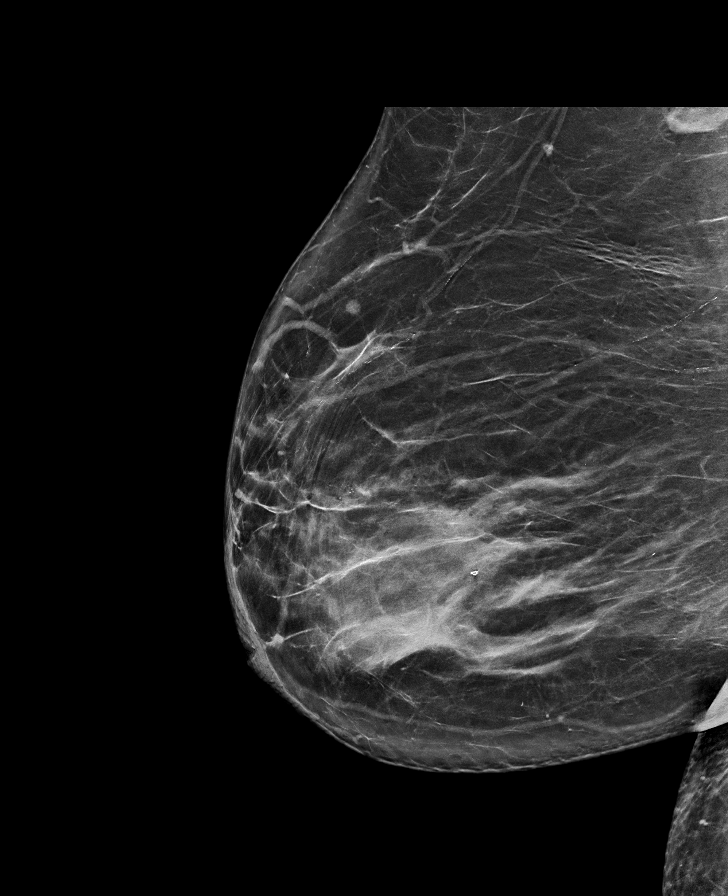

[L MLO synth-2D (2 of 2)]
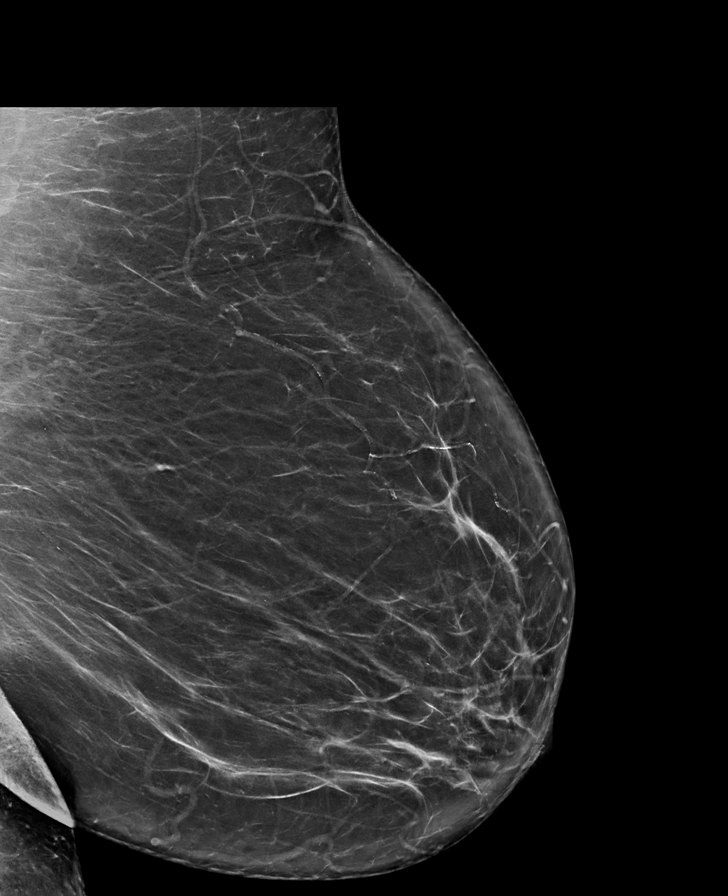

[R CC tomo · tomo slice 43/84.0]
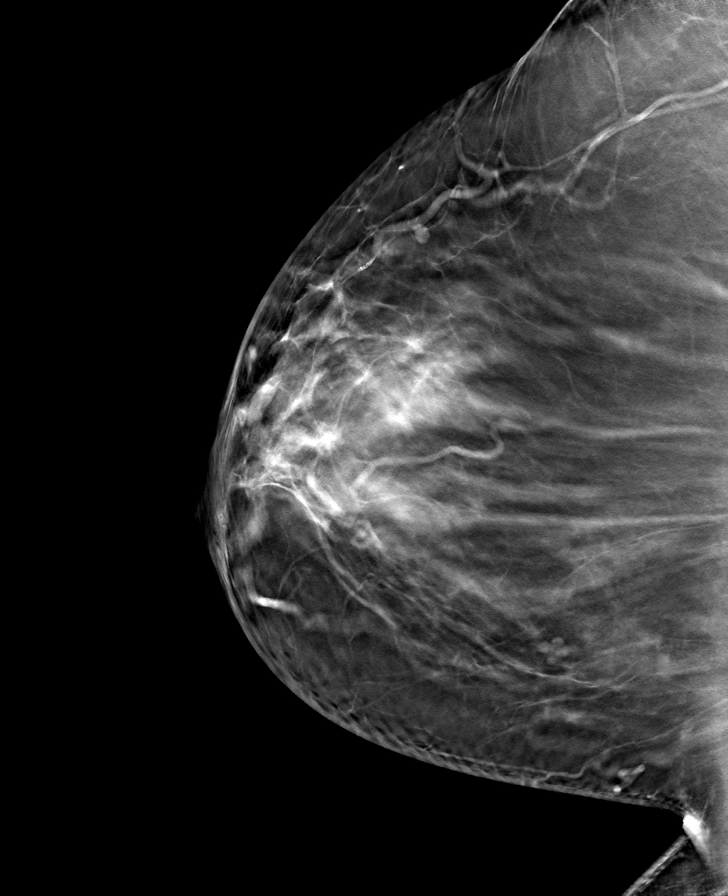

[8 of 40 positions shown; findings below may reference images not displayed]

ACR Breast Density Category b: There are scattered areas of
fibroglandular density.
FINDINGS: No suspicious mass or malignant type microcalcifications identified
in the left breast.

In the lateral and subareolar region of the right breast is
developing asymmetric fibroglandular tissue without a discrete mass.
There is associated overlying skin thickening. There are no
malignant type microcalcifications.

Mammographic images were processed with CAD.

On physical exam, there is erythema in the lower outer quadrant of
the right breast involving the areola and nipple. There is palpable
thickening in this area.

Targeted ultrasound is performed, showing diffuse hyperechogenicity
with hypoechoic stranding in the lower outer quadrant of the right
breast. There is no discrete mass or abscess. Sonographic evaluation
of the right axilla does not show any enlarged adenopathy.
IMPRESSION: Probable mastitis of the right breast.

RECOMMENDATION:
Additional course of antibiotic therapy for the probable mastitis in
the right breast is recommended. Short-term interval follow-up
physical exam and ultrasound of the right breast in 2 weeks is
recommended. If the clinical findings persist surgical consultation
and possible biopsy would be recommended to exclude inflammatory
breast cancer. Findings were called to Dr. [REDACTED] and
discussed with the patient and her daughter.

I have discussed the findings and recommendations with the patient.
Results were also provided in writing at the conclusion of the
visit. If applicable, a reminder letter will be sent to the patient
regarding the next appointment.

BI-RADS CATEGORY  3: Probably benign.

## 2018-08-09 IMAGING — US US BREAST*R* LIMITED INC AXILLA
1 series · 5 of 5 positions shown · non-contrast
Comparison: Previous exam(s).

CLINICAL DATA: 83-year-old female complaining of pain and erythema
in the lateral and subareolar region of the right breast. Patient
has been treated with a 14 day course of amoxicillin/clavulanic. She
reports some improvement following antibiotic therapy but not
resolution.

EXAM:
DIGITAL DIAGNOSTIC BILATERAL MAMMOGRAM WITH CAD AND TOMO
ULTRASOUND LEFT BREAST

[Series 1: us breast*right* limited inc axilla · 0.09mm/px · 5 of 5 slices shown]
[im 1/5]
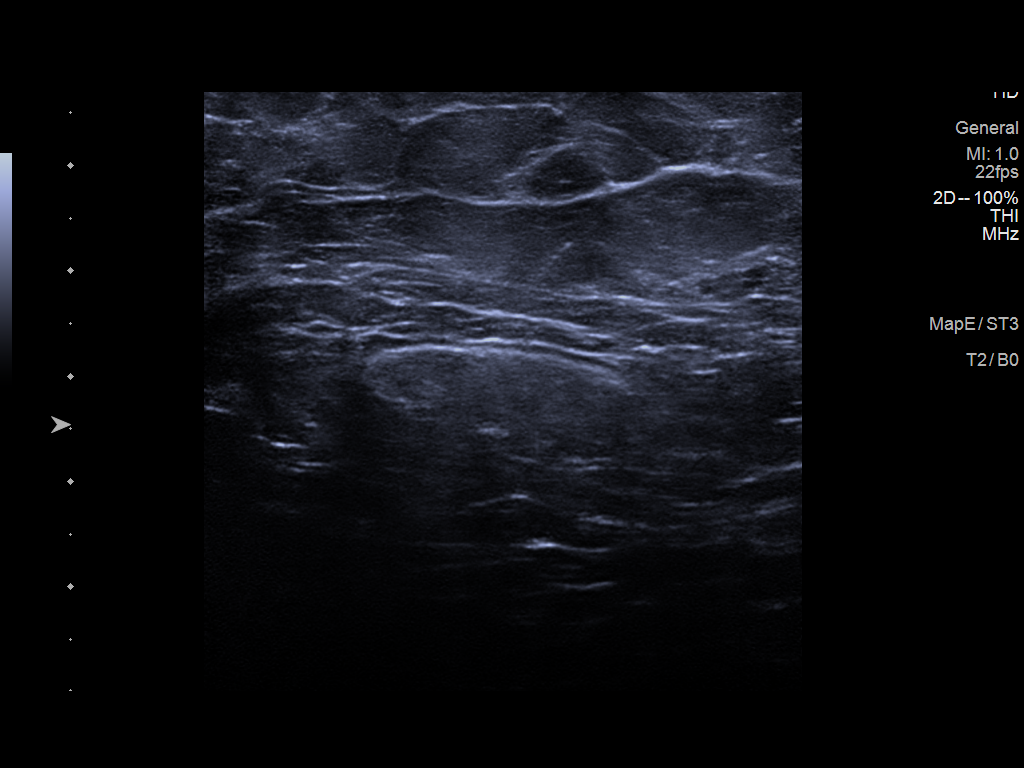
[im 2/5]
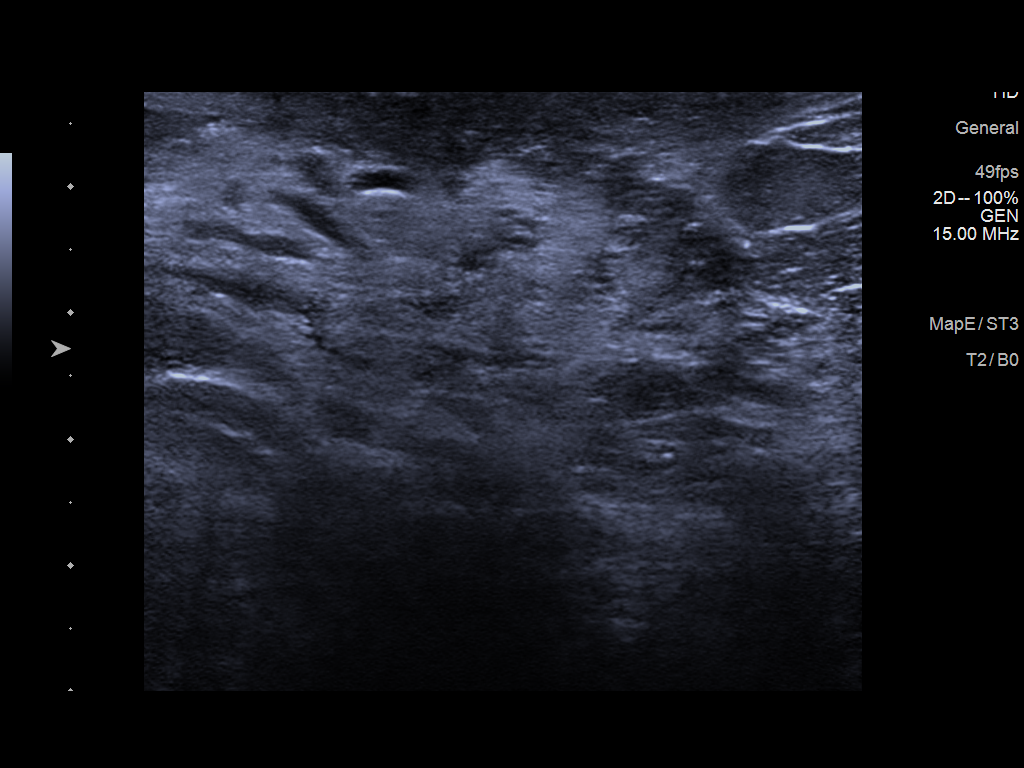
[im 3/5]
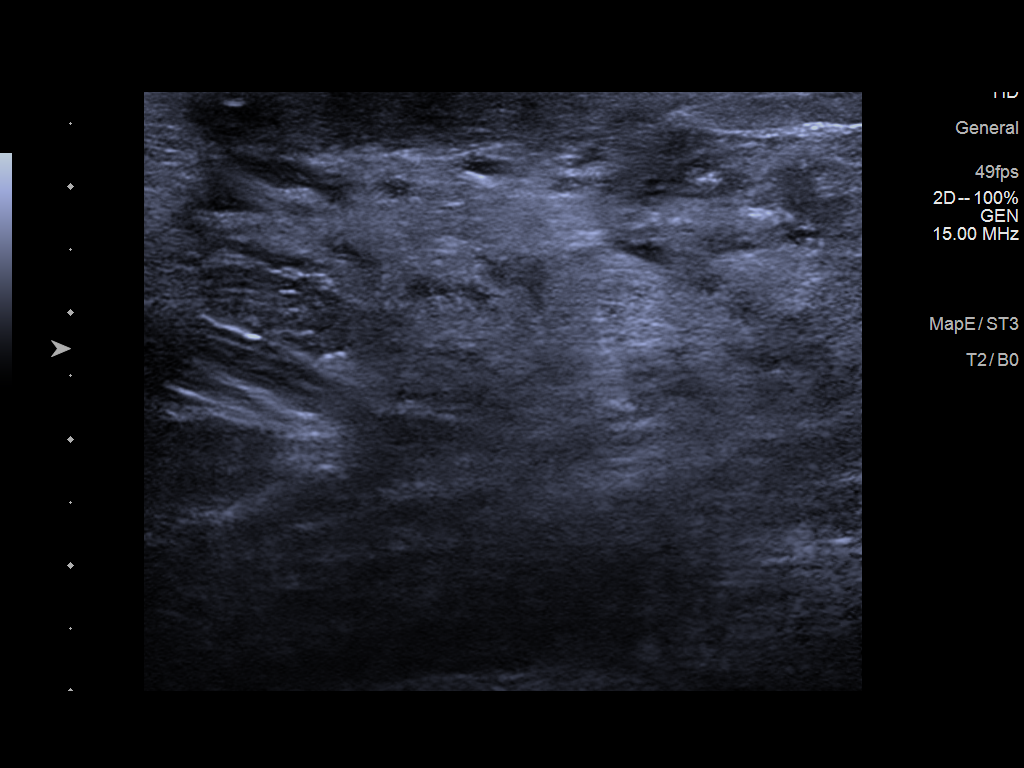
[im 4/5]
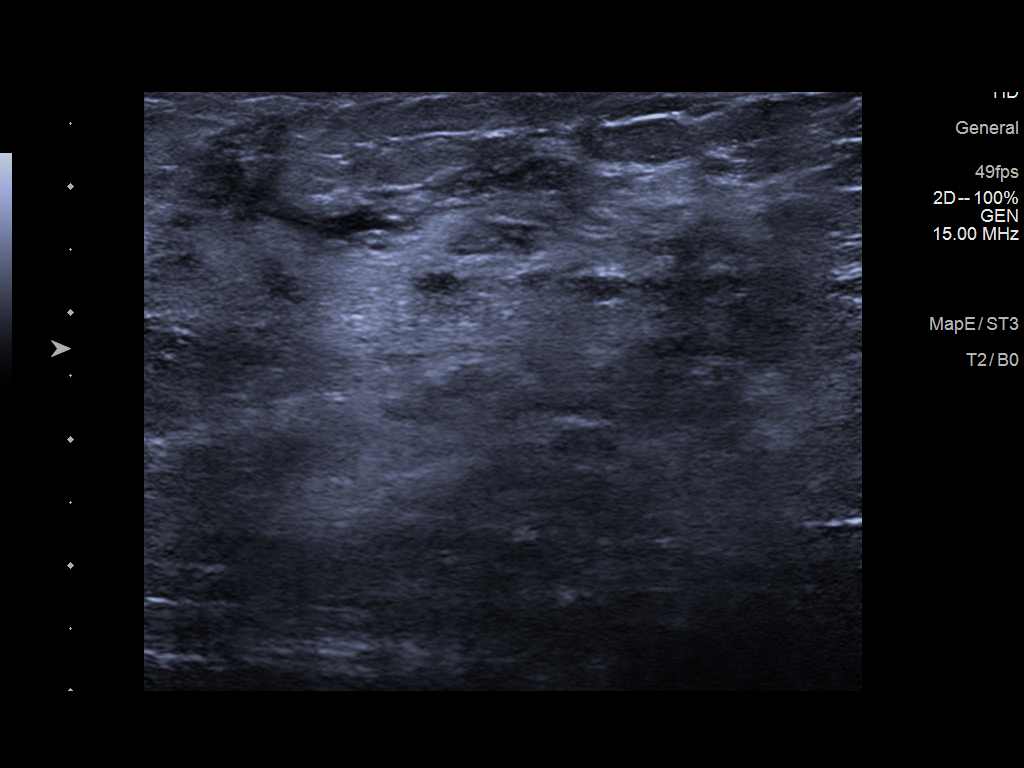
[im 5/5]
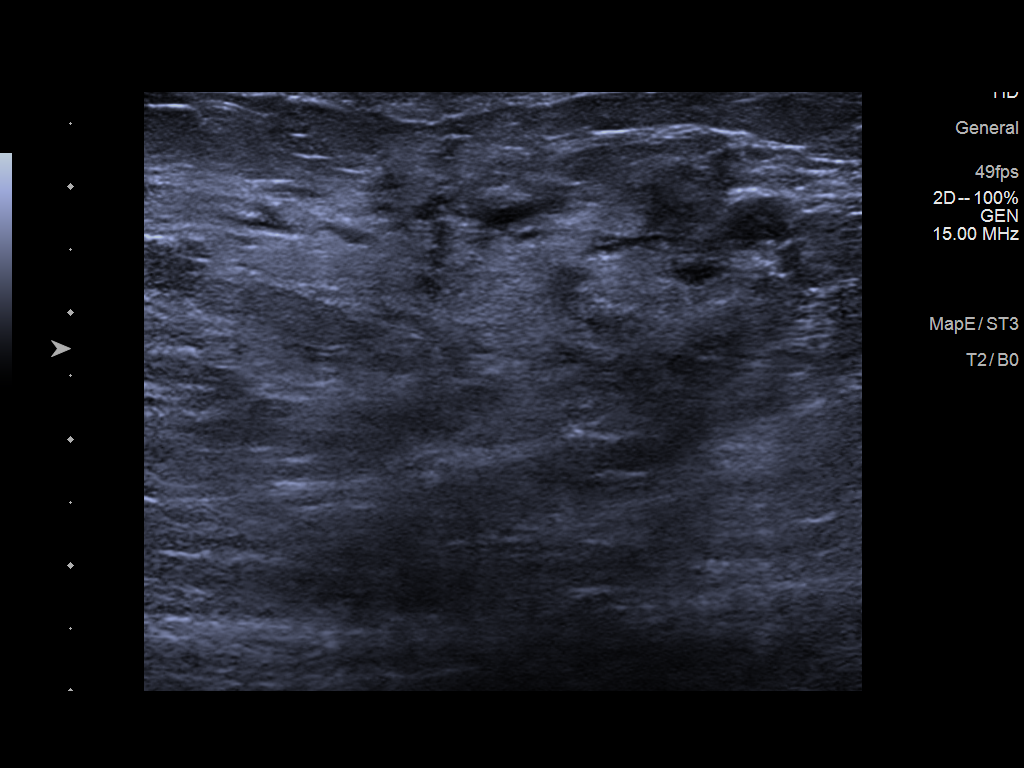

[5 of 5 positions shown; findings below may reference images not displayed]

ACR Breast Density Category b: There are scattered areas of
fibroglandular density.
FINDINGS: No suspicious mass or malignant type microcalcifications identified
in the left breast.

In the lateral and subareolar region of the right breast is
developing asymmetric fibroglandular tissue without a discrete mass.
There is associated overlying skin thickening. There are no
malignant type microcalcifications.

Mammographic images were processed with CAD.

On physical exam, there is erythema in the lower outer quadrant of
the right breast involving the areola and nipple. There is palpable
thickening in this area.

Targeted ultrasound is performed, showing diffuse hyperechogenicity
with hypoechoic stranding in the lower outer quadrant of the right
breast. There is no discrete mass or abscess. Sonographic evaluation
of the right axilla does not show any enlarged adenopathy.
IMPRESSION: Probable mastitis of the right breast.

RECOMMENDATION:
Additional course of antibiotic therapy for the probable mastitis in
the right breast is recommended. Short-term interval follow-up
physical exam and ultrasound of the right breast in 2 weeks is
recommended. If the clinical findings persist surgical consultation
and possible biopsy would be recommended to exclude inflammatory
breast cancer. Findings were called to Dr. [REDACTED] and
discussed with the patient and her daughter.

I have discussed the findings and recommendations with the patient.
Results were also provided in writing at the conclusion of the
visit. If applicable, a reminder letter will be sent to the patient
regarding the next appointment.

BI-RADS CATEGORY  3: Probably benign.

## 2018-08-11 ENCOUNTER — Other Ambulatory Visit: Payer: Self-pay | Admitting: Family Medicine

## 2018-08-11 DIAGNOSIS — R928 Other abnormal and inconclusive findings on diagnostic imaging of breast: Secondary | ICD-10-CM

## 2018-08-11 DIAGNOSIS — N61 Mastitis without abscess: Secondary | ICD-10-CM

## 2018-08-24 ENCOUNTER — Other Ambulatory Visit: Payer: Self-pay

## 2018-08-24 ENCOUNTER — Ambulatory Visit
Admission: RE | Admit: 2018-08-24 | Discharge: 2018-08-24 | Disposition: A | Discharge status: Home / Self Care | Payer: Medicare Other | Source: Ambulatory Visit | Attending: Family Medicine | Admitting: Family Medicine

## 2018-08-24 DIAGNOSIS — N61 Mastitis without abscess: Secondary | ICD-10-CM

## 2018-08-24 DIAGNOSIS — R928 Other abnormal and inconclusive findings on diagnostic imaging of breast: Secondary | ICD-10-CM | POA: Diagnosis present

## 2018-08-24 IMAGING — US ULTRASOUND RIGHT BREAST LIMITED
1 series · 4 of 4 positions shown · non-contrast
Comparison: Previous exams including RIGHT breast ultrasound dated
[DATE].

CLINICAL DATA: Recently treated for mastitis. Patient states that
the skin redness and breast tenderness has resolved.

EXAM:
ULTRASOUND OF THE RIGHT BREAST

[Series 1: ultrasound right breast limited · 0.07mm/px · 4 of 4 slices shown]
[im 1/4]
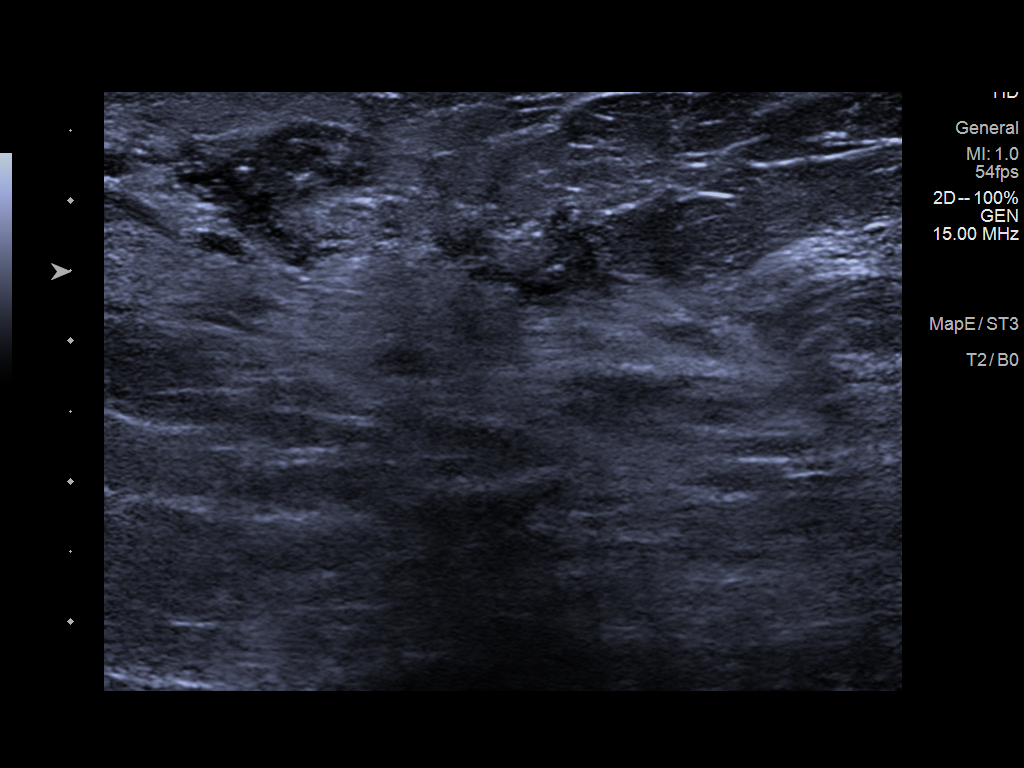
[im 2/4]
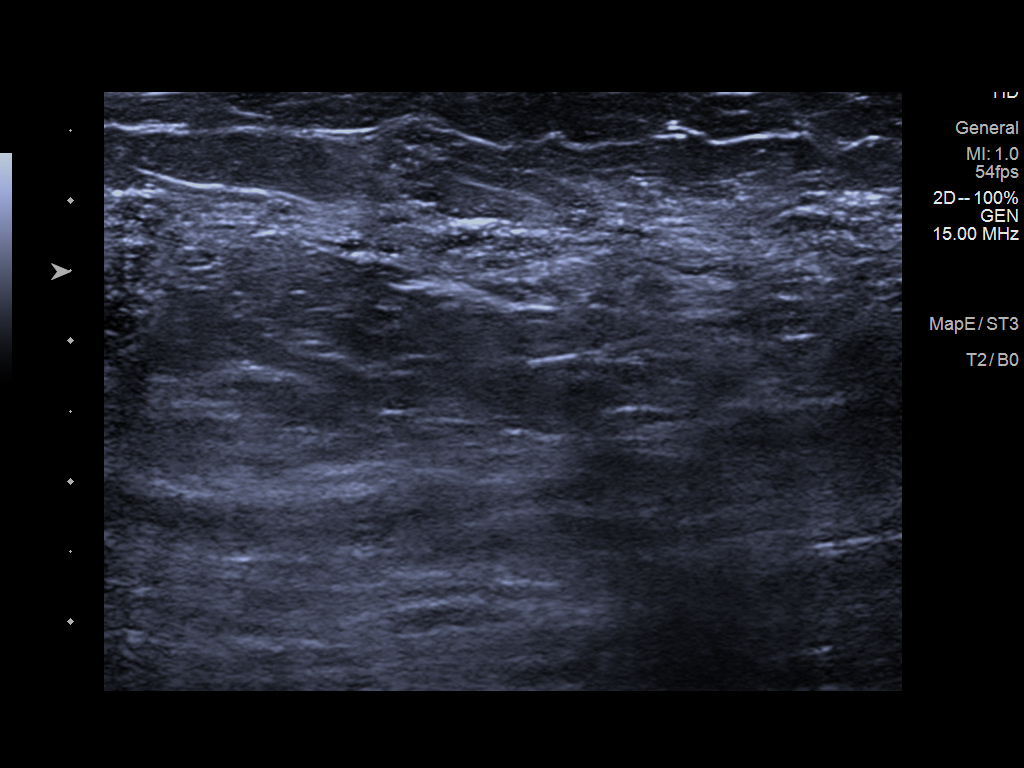
[im 3/4]
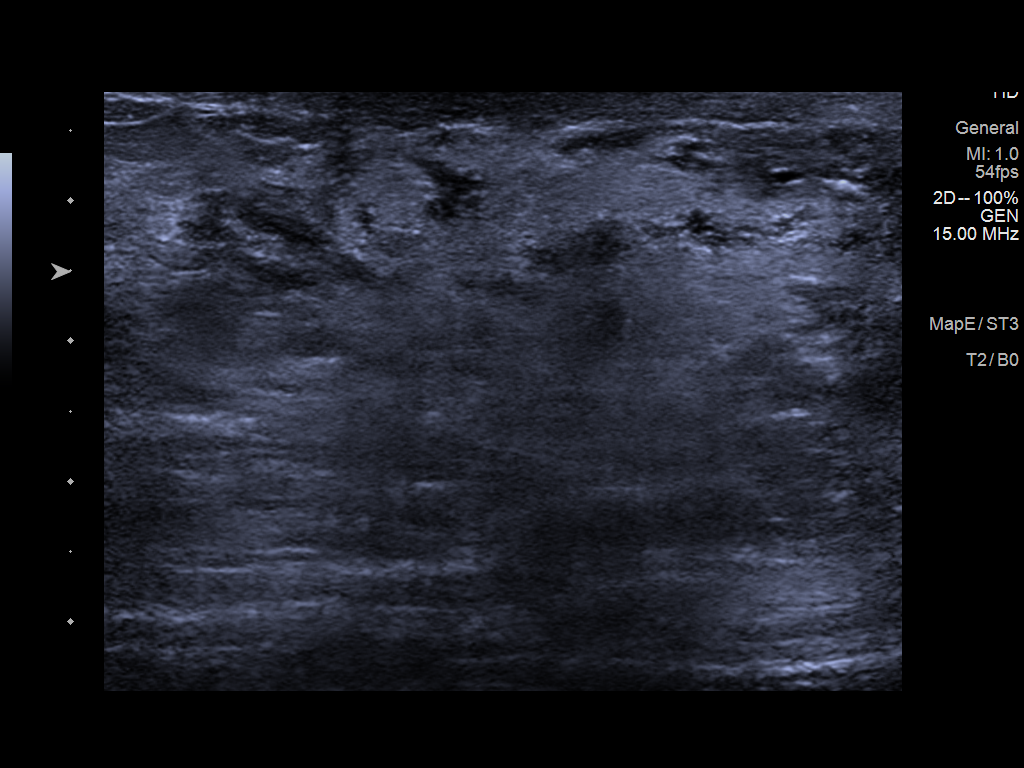
[im 4/4]
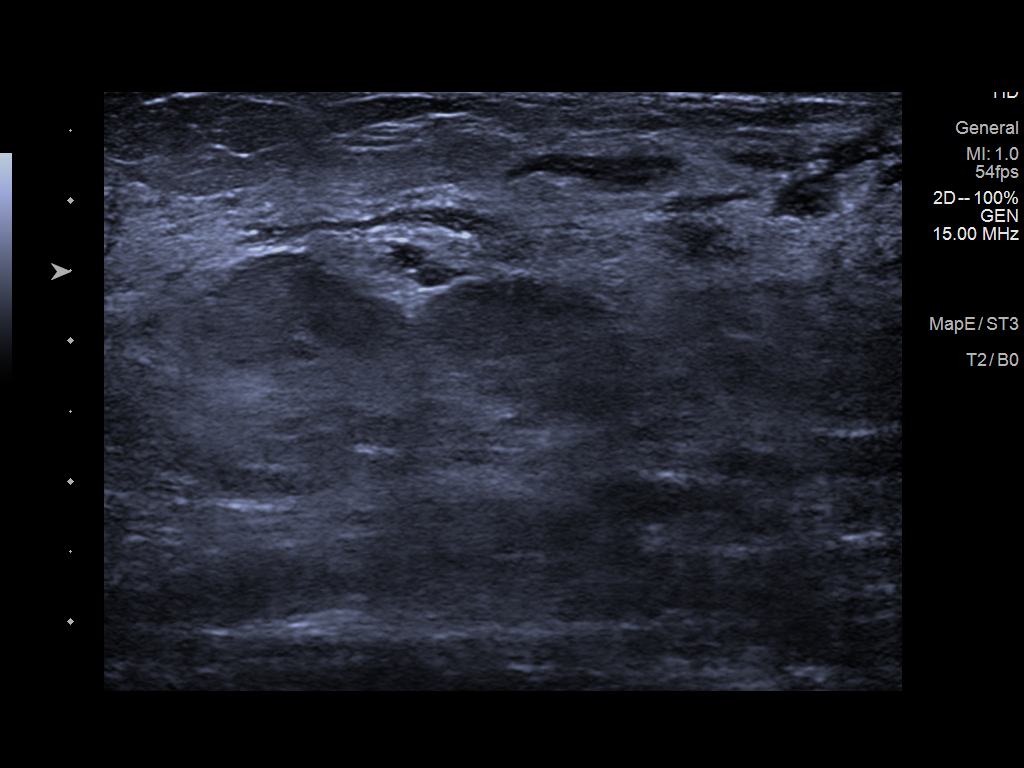

[4 of 4 positions shown; findings below may reference images not displayed]

FINDINGS: On physical exam, there is no appreciable skin redness. Patient
states that the associated breast pain as resolved.

Targeted ultrasound is performed, evaluating the outer and inferior
RIGHT breast, showing only normal fibroglandular tissues and fat
lobules. No abscess collection. No suspicious solid or cystic mass.
IMPRESSION: No evidence of malignancy. Patient indicates that clinical symptoms
of mastitis have resolved after second course of antibiotics.

RECOMMENDATION:
1.  Screening mammogram in one year.(Code:[A6])
2. Patient was encouraged to follow-up with referring physician if
the skin redness or breast pain returned.

I have discussed the findings and recommendations with the patient.
Results were also provided in writing at the conclusion of the
visit. If applicable, a reminder letter will be sent to the patient
regarding the next appointment.

BI-RADS CATEGORY  1: Negative.

## 2018-09-27 ENCOUNTER — Other Ambulatory Visit: Payer: Self-pay

## 2018-09-28 ENCOUNTER — Inpatient Hospital Stay: Payer: Medicare Other

## 2018-09-28 ENCOUNTER — Encounter (INDEPENDENT_AMBULATORY_CARE_PROVIDER_SITE_OTHER): Payer: Self-pay

## 2018-09-28 ENCOUNTER — Inpatient Hospital Stay: Payer: Medicare Other | Attending: Oncology | Admitting: Oncology

## 2018-09-28 ENCOUNTER — Other Ambulatory Visit: Payer: Self-pay

## 2018-09-28 ENCOUNTER — Encounter: Payer: Self-pay | Admitting: Oncology

## 2018-09-28 VITALS — BP 159/85 | HR 86 | Resp 18 | Ht 60.0 in | Wt 203.0 lb

## 2018-09-28 DIAGNOSIS — Z79899 Other long term (current) drug therapy: Secondary | ICD-10-CM | POA: Diagnosis not present

## 2018-09-28 DIAGNOSIS — M810 Age-related osteoporosis without current pathological fracture: Secondary | ICD-10-CM | POA: Diagnosis not present

## 2018-09-28 DIAGNOSIS — D71 Functional disorders of polymorphonuclear neutrophils: Secondary | ICD-10-CM | POA: Insufficient documentation

## 2018-09-28 DIAGNOSIS — N6489 Other specified disorders of breast: Secondary | ICD-10-CM | POA: Insufficient documentation

## 2018-09-28 DIAGNOSIS — M25512 Pain in left shoulder: Secondary | ICD-10-CM | POA: Insufficient documentation

## 2018-09-28 DIAGNOSIS — N61 Mastitis without abscess: Secondary | ICD-10-CM

## 2018-09-28 DIAGNOSIS — D729 Disorder of white blood cells, unspecified: Secondary | ICD-10-CM

## 2018-09-28 DIAGNOSIS — I10 Essential (primary) hypertension: Secondary | ICD-10-CM | POA: Diagnosis not present

## 2018-09-28 DIAGNOSIS — Z803 Family history of malignant neoplasm of breast: Secondary | ICD-10-CM | POA: Insufficient documentation

## 2018-09-28 DIAGNOSIS — M25511 Pain in right shoulder: Secondary | ICD-10-CM | POA: Diagnosis not present

## 2018-09-28 NOTE — Progress Notes (Signed)
Patient here today for initial evaluation. Patient reports she had swelling in her breast that was treated with antibiotics, improved but returned. Patient also reports pain to her shoulders, back of legs and hands that has developed, referred to [facility] by rheumatology.

## 2018-09-30 NOTE — Progress Notes (Signed)
Hematology/Oncology Consult note [facility] Telephone:([phone] [phone]   Patient Care Team: [place] Pink, MD as PCP - General (Family Medicine)  REFERRING PROVIDER: Annalee Genta, Christele*  CHIEF COMPLAINTS/REASON FOR VISIT:  Evaluation of right breast swelling.   HISTORY OF PRESENTING ILLNESS:   Mary Mckay is a  84 y.o.  female with PMH listed below was seen in consultation at the request of  Behalal-Bock, [place]*  for evaluation of right breast swelling.   Patient reports that she noticed right breast pain in July 2020 and had drainage around right nipple. She took 2 rounds of antibiotics to treat breast cellulitis and pain has resolved. Today she has no breast pain or discomfort. She feels that her right breast maybe a bit swelling.  Most of her concerns today was bilateral shoulder pain, right worse than left, pain is worsen with joint movement, especially when she raises her hand to comb her hair.  Denies any personal history of cancer, denies family history of cancer.   08/09/2018 bilateral diagnostic mammogram and targeted US showed no suspicious mass or malignant type microcalcification in left breast  09/06/18 cbc showed normal wbc, hemoglobin and platelet. Differential showed slightly elevated neutrophil %, normal ANC.  Today patient reports no breast pain, fever chills, cough, chest pain or abdominal pain.   Review of Systems  Constitutional: Negative for appetite change, chills, fatigue and fever.  HENT:   Negative for hearing loss and voice change.   Eyes: Negative for eye problems.  Respiratory: Negative for chest tightness and cough.   Cardiovascular: Negative for chest pain.  Gastrointestinal: Negative for abdominal distention, abdominal pain and blood in stool.  Endocrine: Negative for hot flashes.  Genitourinary: Negative for difficulty urinating and frequency.   Musculoskeletal: Positive for arthralgias.  Skin:  Negative for itching and rash.  Neurological: Negative for extremity weakness.  Hematological: Negative for adenopathy.  Psychiatric/Behavioral: Negative for confusion.    MEDICAL HISTORY:  Past Medical History:  Diagnosis Date   Allergic state    Arthritis    Cataract cortical, senile    Diverticulitis    Glaucoma    Hypertension    Osteoporosis     SURGICAL HISTORY: Past Surgical History:  Procedure Laterality Date   ABDOMINAL HYSTERECTOMY     BLADDER SURGERY     BREAST BIOPSY Right early 2000s   benign   COLONOSCOPY WITH PROPOFOL N/A 03/25/2017   Procedure: COLONOSCOPY WITH PROPOFOL;  Surgeon: [name], MD;  [facility] ENDOSCOPY;  Service: Endoscopy;  Laterality: N/A;   WRIST SURGERY     Right     SOCIAL HISTORY: Social History   Socioeconomic History   Marital status: Married    Spouse name: Not on file   Number of children: Not on file   Years of education: Not on file   Highest education level: Not on file  Occupational History   Not on file  Social Needs   Financial resource strain: Not on file   Food insecurity    Worry: Not on file    Inability: Not on file   Transportation needs    Medical: Not on file    Non-medical: Not on file  Tobacco Use   Smoking status: Never Smoker   Smokeless tobacco: Never Used  Substance and Sexual Activity   Alcohol use: Not Currently    Frequency: Never   Drug use: Never   Sexual activity: Not on file    Comment: Married   Lifestyle  Physical activity    Days per week: Not on file    Minutes per session: Not on file   Stress: Not on file  Relationships   Social connections    Talks on phone: Not on file    Gets together: Not on file    Attends religious service: Not on file    Active member of club or organization: Not on file    Attends meetings of clubs or organizations: Not on file    Relationship status: Not on file   Intimate partner violence    Fear of  current or ex partner: Not on file    Emotionally abused: Not on file    Physically abused: Not on file    Forced sexual activity: Not on file  Other Topics Concern   Not on file  Social History Narrative   Not on file    FAMILY HISTORY: Family History  Problem Relation Age of Onset   Osteoporosis Mother    Stroke Mother    Heart attack Father    Alcohol abuse Sister    Breast cancer Sister    Kidney disease Sister    Allergic rhinitis Sister    Diabetes Sister    Heart attack Sister    Stroke Sister    Hypertension Sister    Diabetes Brother    Heart attack Brother    Hypertension Brother     ALLERGIES:  is allergic to fentanyl and meloxicam.  MEDICATIONS:  Current Outpatient Medications  Medication Sig Dispense Refill   atenolol (TENORMIN) 100 MG tablet Take 100 mg daily by mouth.     candesartan (ATACAND) 32 MG tablet Take 32 mg daily by mouth.     fluticasone (VERAMYST) 27.5 MCG/SPRAY nasal spray Place 2 sprays daily into the nose.     hydrochlorothiazide (HYDRODIURIL) 25 MG tablet Take 25 mg daily by mouth.     latanoprost (XALATAN) 0.005 % ophthalmic solution Place 1 drop into both eyes at bedtime.     linagliptin (TRADJENTA) 5 MG TABS tablet Take 5 mg daily by mouth.     NIFEdipine (PROCARDIA-XL/ADALAT CC) 30 MG 24 hr tablet Take 30 mg daily by mouth.     OMEGA 3-6-9 FATTY ACIDS PO Take 1,200 mg by mouth daily.     potassium chloride (K-DUR,KLOR-CON) 10 MEQ tablet Take 10 mEq by mouth 2 (two) times daily.     psyllium (METAMUCIL) 58.6 % packet Take 1 packet by mouth daily.     traMADol (ULTRAM) 50 MG tablet Take 50 mg every 8 (eight) hours as needed by mouth.     No current facility-administered medications for this visit.      PHYSICAL EXAMINATION: ECOG PERFORMANCE STATUS: 1 - Symptomatic but completely ambulatory Vitals:   09/28/18 1009 09/28/18 1014  BP:  (!) 159/85  Pulse:  86  Resp: 18    Filed Weights   09/28/18 1009    Weight: 203 lb (92.1 kg)    Physical Exam Constitutional:      General: She is not in acute distress.    Appearance: She is obese.  HENT:     Head: Normocephalic and atraumatic.  Eyes:     General: No scleral icterus.    Pupils: Pupils are equal, round, and reactive to light.  Neck:     Musculoskeletal: Normal range of motion and neck supple.  Cardiovascular:     Rate and Rhythm: Normal rate and regular rhythm.     Heart sounds: Normal heart  sounds.  Pulmonary:     Effort: Pulmonary effort is normal. No respiratory distress.     Breath sounds: No wheezing.  Abdominal:     General: Bowel sounds are normal. There is no distension.     Palpations: Abdomen is soft. There is no mass.     Tenderness: There is no abdominal tenderness.  Musculoskeletal: Normal range of motion.        General: No deformity.  Skin:    General: Skin is warm and dry.     Findings: No erythema or rash.  Neurological:     Mental Status: She is alert and oriented to person, place, and time.     Cranial Nerves: No cranial nerve deficit.     Coordination: Coordination normal.  Psychiatric:        Behavior: Behavior normal.        Thought Content: Thought content normal.   Breast exam was performed in seated and lying down position.  No palpable masses or lumps in bilateral breasts. No palpable axillary adenopathy Right breast skin appears normal. No erythema or edema. No nipple discharge.  Right breast   Left breast      LABORATORY DATA:  I have reviewed the data as listed Lab Results  Component Value Date   WBC 6.4 11/20/2016   HGB 13.9 11/20/2016   HCT 41.8 11/20/2016   MCV 90.2 11/20/2016   PLT 185 11/20/2016   No results for input(s): NA, K, CL, CO2, GLUCOSE, BUN, CREATININE, CALCIUM, GFRNONAA, GFRAA, PROT, ALBUMIN, AST, ALT, ALKPHOS, BILITOT, BILIDIR, IBILI in the last 8760 hours. Iron/TIBC/Ferritin/ %Sat No results found for: IRON, TIBC, FERRITIN, IRONPCTSAT    RADIOGRAPHIC  STUDIES: I have personally reviewed the radiological images as listed and agreed with the findings in the report. US Breast Ltd Uni Right Inc Axilla  Result Date: 08/24/2018 CLINICAL DATA:  Recently treated for mastitis. Patient states that the skin redness and breast tenderness has resolved. EXAM: ULTRASOUND OF THE RIGHT BREAST COMPARISON:  Previous exams including RIGHT breast ultrasound dated 08/09/2018. FINDINGS: On physical exam, there is no appreciable skin redness. Patient states that the associated breast pain as resolved. Targeted ultrasound is performed, evaluating the outer and inferior RIGHT breast, showing only normal fibroglandular tissues and fat lobules. No abscess collection. No suspicious solid or cystic mass. IMPRESSION: No evidence of malignancy. Patient indicates that clinical symptoms of mastitis have resolved after second course of antibiotics. RECOMMENDATION: 1.  Screening mammogram in one year.(Code:SM-B-01Y) 2. Patient was encouraged to follow-up with referring physician if the skin redness or breast pain returned. I have discussed the findings and recommendations with the patient. Results were also provided in writing at the conclusion of the visit. If applicable, a reminder letter will be sent to the patient regarding the next appointment. BI-RADS CATEGORY  1: Negative. Electronically Signed   By: Franki Cabot M.D.   On: 08/24/2018 09:44   US Breast Ltd Uni Right Inc Axilla  Result Date: 08/09/2018 CLINICAL DATA:  84 year old female complaining of pain and erythema in the lateral and subareolar region of the right breast. Patient has been treated with a 14 day course of amoxicillin/clavulanic. She reports some improvement following antibiotic therapy but not resolution. EXAM: DIGITAL DIAGNOSTIC BILATERAL MAMMOGRAM WITH CAD AND TOMO ULTRASOUND LEFT BREAST COMPARISON:  Previous exam(s). ACR Breast Density Category b: There are scattered areas of fibroglandular density. FINDINGS: No  suspicious mass or malignant type microcalcifications identified in the left breast. In the lateral and subareolar  region of the right breast is developing asymmetric fibroglandular tissue without a discrete mass. There is associated overlying skin thickening. There are no malignant type microcalcifications. Mammographic images were processed with CAD. On physical exam, there is erythema in the lower outer quadrant of the right breast involving the areola and nipple. There is palpable thickening in this area. Targeted ultrasound is performed, showing diffuse hyperechogenicity with hypoechoic stranding in the lower outer quadrant of the right breast. There is no discrete mass or abscess. Sonographic evaluation of the right axilla does not show any enlarged adenopathy. IMPRESSION: Probable mastitis of the right breast. RECOMMENDATION: Additional course of antibiotic therapy for the probable mastitis in the right breast is recommended. Short-term interval follow-up physical exam and ultrasound of the right breast in 2 weeks is recommended. If the clinical findings persist surgical consultation and possible biopsy would be recommended to exclude inflammatory breast cancer. Findings were called to Dr. Barbarann Ehlers office and discussed with the patient and her daughter. I have discussed the findings and recommendations with the patient. Results were also provided in writing at the conclusion of the visit. If applicable, a reminder letter will be sent to the patient regarding the next appointment. BI-RADS CATEGORY  3: Probably benign. Electronically Signed   By: Lillia Mountain M.D.   On: 08/09/2018 16:33   Mm Diag Breast Tomo Bilateral  Result Date: 08/09/2018 CLINICAL DATA:  84 year old female complaining of pain and erythema in the lateral and subareolar region of the right breast. Patient has been treated with a 14 day course of amoxicillin/clavulanic. She reports some improvement following antibiotic therapy but not  resolution. EXAM: DIGITAL DIAGNOSTIC BILATERAL MAMMOGRAM WITH CAD AND TOMO ULTRASOUND LEFT BREAST COMPARISON:  Previous exam(s). ACR Breast Density Category b: There are scattered areas of fibroglandular density. FINDINGS: No suspicious mass or malignant type microcalcifications identified in the left breast. In the lateral and subareolar region of the right breast is developing asymmetric fibroglandular tissue without a discrete mass. There is associated overlying skin thickening. There are no malignant type microcalcifications. Mammographic images were processed with CAD. On physical exam, there is erythema in the lower outer quadrant of the right breast involving the areola and nipple. There is palpable thickening in this area. Targeted ultrasound is performed, showing diffuse hyperechogenicity with hypoechoic stranding in the lower outer quadrant of the right breast. There is no discrete mass or abscess. Sonographic evaluation of the right axilla does not show any enlarged adenopathy. IMPRESSION: Probable mastitis of the right breast. RECOMMENDATION: Additional course of antibiotic therapy for the probable mastitis in the right breast is recommended. Short-term interval follow-up physical exam and ultrasound of the right breast in 2 weeks is recommended. If the clinical findings persist surgical consultation and possible biopsy would be recommended to exclude inflammatory breast cancer. Findings were called to Dr. Barbarann Ehlers office and discussed with the patient and her daughter. I have discussed the findings and recommendations with the patient. Results were also provided in writing at the conclusion of the visit. If applicable, a reminder letter will be sent to the patient regarding the next appointment. BI-RADS CATEGORY  3: Probably benign. Electronically Signed   By: Lillia Mountain M.D.   On: 08/09/2018 16:33      ASSESSMENT & PLAN:  1. Cellulitis of breast   2. Neutrophilia    History of breast  cellulitis Mammogram and targeted US did not show evidence of malignancy. Physical examination is non remarkable.  Recommend observation.  Return in 3 months for  another examination.   Mild neutrophilia, normal ANC.  Likely reactive. Reassurance was provided.  Called patient's daughter and updated. Her.   No orders of the defined types were placed in this encounter.   All questions were answered. The patient knows to call the clinic with any problems questions or concerns.   Behalal-Bock, Christele*    Return of visit: 3 months Thank you for this kind referral and the opportunity to participate in the care of this patient. A copy of today's note is routed to referring provider  Total face to face encounter time for this patient visit was 45 min. >50% of the time was  spent in counseling and coordination of care.    Earlie Server, MD, PhD Hematology Oncology [facility] Pager- [phone] 09/30/2018

## 2018-10-06 ENCOUNTER — Emergency Department
Admission: EM | Admit: 2018-10-06 | Discharge: 2018-10-07 | Disposition: A | Discharge status: Home / Self Care | Payer: Medicare Other | Attending: Emergency Medicine | Admitting: Emergency Medicine

## 2018-10-06 ENCOUNTER — Other Ambulatory Visit: Payer: Self-pay

## 2018-10-06 DIAGNOSIS — I10 Essential (primary) hypertension: Secondary | ICD-10-CM | POA: Insufficient documentation

## 2018-10-06 DIAGNOSIS — K5732 Diverticulitis of large intestine without perforation or abscess without bleeding: Secondary | ICD-10-CM | POA: Diagnosis not present

## 2018-10-06 DIAGNOSIS — Z79899 Other long term (current) drug therapy: Secondary | ICD-10-CM | POA: Diagnosis not present

## 2018-10-06 DIAGNOSIS — K5641 Fecal impaction: Secondary | ICD-10-CM

## 2018-10-06 DIAGNOSIS — K6289 Other specified diseases of anus and rectum: Secondary | ICD-10-CM | POA: Diagnosis present

## 2018-10-06 LAB — COMPREHENSIVE METABOLIC PANEL
ALT: 29 U/L (ref 0–44)
AST: 24 U/L (ref 15–41)
Albumin: 3.7 g/dL (ref 3.5–5.0)
Alkaline Phosphatase: 46 U/L (ref 38–126)
Anion gap: 11 (ref 5–15)
BUN: 15 mg/dL (ref 8–23)
CO2: 25 mmol/L (ref 22–32)
Calcium: 9.7 mg/dL (ref 8.9–10.3)
Chloride: 100 mmol/L (ref 98–111)
Creatinine, Ser: 0.61 mg/dL (ref 0.44–1.00)
GFR calc Af Amer: >60 mL/min (ref >60)
GFR calc non Af Amer: >60 mL/min (ref >60)
Glucose, Bld: 124 mg/dL — ABNORMAL HIGH (ref 70–99)
Potassium: 3.4 mmol/L — ABNORMAL LOW (ref 3.5–5.1)
Sodium: 136 mmol/L (ref 135–145)
Total Bilirubin: 0.7 mg/dL (ref 0.3–1.2)
Total Protein: 7.7 g/dL (ref 6.5–8.1)

## 2018-10-06 LAB — URINALYSIS, ROUTINE W REFLEX MICROSCOPIC
Bacteria, UA: NONE SEEN
Bilirubin Urine: NEGATIVE
Glucose, UA: NEGATIVE mg/dL
Ketones, ur: NEGATIVE mg/dL
Nitrite: NEGATIVE
Protein, ur: 30 mg/dL — AB
Specific Gravity, Urine: 1.019 (ref 1.005–1.030)
pH: 5 (ref 5.0–8.0)

## 2018-10-06 LAB — CBC WITH DIFFERENTIAL/PLATELET
Abs Immature Granulocytes: 0.03 10*3/uL (ref 0.00–0.07)
Basophils Absolute: 0 10*3/uL (ref 0.0–0.1)
Basophils Relative: 0 %
Eosinophils Absolute: 0 10*3/uL (ref 0.0–0.5)
Eosinophils Relative: 0 %
HCT: 39.5 % (ref 36.0–46.0)
Hemoglobin: 12.4 g/dL (ref 12.0–15.0)
Immature Granulocytes: 0 %
Lymphocytes Relative: 16 %
Lymphs Abs: 1.4 10*3/uL (ref 0.7–4.0)
MCH: 28.5 pg (ref 26.0–34.0)
MCHC: 31.4 g/dL (ref 30.0–36.0)
MCV: 90.8 fL (ref 80.0–100.0)
Monocytes Absolute: 0.4 10*3/uL (ref 0.1–1.0)
Monocytes Relative: 5 %
Neutro Abs: 6.8 10*3/uL (ref 1.7–7.7)
Neutrophils Relative %: 79 %
Platelets: 265 10*3/uL (ref 150–400)
RBC: 4.35 MIL/uL (ref 3.87–5.11)
RDW: 13.6 % (ref 11.5–15.5)
WBC: 8.6 10*3/uL (ref 4.0–10.5)
nRBC: 0 % (ref 0.0–0.2)

## 2018-10-06 MED ORDER — IOHEXOL 9 MG/ML PO SOLN
500.0000 mL | ORAL | Status: AC
  Administered 2018-10-07: 500 mL via ORAL

## 2018-10-06 NOTE — ED Provider Notes (Signed)
[facility] Emergency Department Provider Note ______   First MD Initiated Contact with Patient 10/06/18 2342     (approximate)  I have reviewed the triage vital signs and the nursing notes.   HISTORY  Chief Complaint Rectal Pain and Urinary Retention    HPI Mary Mckay is a 84 y.o. female with below list of previous medical conditions presents to the emergency department secondary to rectal discomfort difficulty urinating lower abdominal pain which patient states is been occurring since Wednesday.  Patient denies any fever.  Patient denies any nausea or vomiting.  Patient denies any constipation or diarrhea.       Past Medical History:  Diagnosis Date   Allergic state    Arthritis    Cataract cortical, senile    Diverticulitis    Glaucoma    Hypertension    Osteoporosis     There are no active problems to display for this patient.   Past Surgical History:  Procedure Laterality Date   ABDOMINAL HYSTERECTOMY     BLADDER SURGERY     BREAST BIOPSY Right early 2000s   benign   COLONOSCOPY WITH PROPOFOL N/A 03/25/2017   Procedure: COLONOSCOPY WITH PROPOFOL;  Surgeon: [name], MD;  Location: [facility] ENDOSCOPY;  Service: Endoscopy;  Laterality: N/A;   WRIST SURGERY     Right     Prior to Admission medications   Medication Sig Start Date End Date Taking? Authorizing Provider  amoxicillin-clavulanate (AUGMENTIN) 875-125 MG tablet Take 1 tablet by mouth 2 (two) times daily for 14 days. 10/07/18 10/21/18  Gregor Hams, MD  atenolol (TENORMIN) 100 MG tablet Take 100 mg daily by mouth.    [provider]  candesartan (ATACAND) 32 MG tablet Take 32 mg daily by mouth.    [provider]  fluticasone (VERAMYST) 27.5 MCG/SPRAY nasal spray Place 2 sprays daily into the nose.    [provider]  hydrochlorothiazide (HYDRODIURIL) 25 MG tablet Take 25 mg daily by mouth.    [provider]    latanoprost (XALATAN) 0.005 % ophthalmic solution Place 1 drop into both eyes at bedtime.    [provider]  linagliptin (TRADJENTA) 5 MG TABS tablet Take 5 mg daily by mouth.    [provider]  NIFEdipine (PROCARDIA-XL/ADALAT CC) 30 MG 24 hr tablet Take 30 mg daily by mouth.    [provider]  OMEGA 3-6-9 FATTY ACIDS PO Take 1,200 mg by mouth daily.    [provider]  potassium chloride (K-DUR,KLOR-CON) 10 MEQ tablet Take 10 mEq by mouth 2 (two) times daily.    [provider]  psyllium (METAMUCIL) 58.6 % packet Take 1 packet by mouth daily.    [provider]  traMADol (ULTRAM) 50 MG tablet Take 50 mg every 8 (eight) hours as needed by mouth.    [provider]    Allergies Fentanyl and Meloxicam  Family History  Problem Relation Age of Onset   Osteoporosis Mother    Stroke Mother    Heart attack Father    Alcohol abuse Sister    Breast cancer Sister    Kidney disease Sister    Allergic rhinitis Sister    Diabetes Sister    Heart attack Sister    Stroke Sister    Hypertension Sister    Diabetes Brother    Heart attack Brother    Hypertension Brother     Social History Social History   Tobacco Use  Smoking status: Never Smoker   Smokeless tobacco: Never Used  Substance Use Topics   Alcohol use: Not Currently    Frequency: Never   Drug use: Never    Review of Systems Constitutional: No fever/chills Eyes: No visual changes. ENT: No sore throat. Cardiovascular: Denies chest pain. Respiratory: Denies shortness of breath. Gastrointestinal: Positive for abdominal and rectal pain.  No nausea, no vomiting.  No diarrhea.  No constipation. Genitourinary: Negative for dysuria. Musculoskeletal: Negative for neck pain.  Negative for back pain. Integumentary: Negative for rash. Neurological: Negative for headaches, focal weakness or  numbness.   ____________________________________________   PHYSICAL EXAM:  VITAL SIGNS: ED Triage Vitals  Enc Vitals Group     BP 10/06/18 1908 (!) 175/70     Pulse Rate 10/06/18 1908 73     Resp 10/06/18 1908 20     Temp 10/06/18 1908 98.4 F (36.9 C)     Temp Source 10/06/18 1908 Oral     SpO2 10/06/18 1908 98 %     Weight 10/06/18 1906 92.5 kg (204 lb)     Height 10/06/18 1906 1.524 m (5')     Head Circumference --      Peak Flow --      Pain Score 10/06/18 1905 6     Pain Loc --      Pain Edu? --      Excl. in [place]? --     Constitutional: Alert and oriented.  Eyes: Conjunctivae are normal.  Mouth/Throat: Mucous membranes are moist. Neck: No stridor.  No meningeal signs.   Cardiovascular: Normal rate, regular rhythm. Good peripheral circulation. Grossly normal heart sounds. Respiratory: Normal respiratory effort.  No retractions. Gastrointestinal: Left lower quadrant/right lower quadrant tenderness to palpation.. No distention.  Musculoskeletal: No lower extremity tenderness nor edema. No gross deformities of extremities. Neurologic:  Normal speech and language. No gross focal neurologic deficits are appreciated.  Skin:  Skin is warm, dry and intact. Psychiatric: Mood and affect are normal. Speech and behavior are normal.  ____________________________________________   LABS (all labs ordered are listed, but only abnormal results are displayed)  Labs Reviewed  COMPREHENSIVE METABOLIC PANEL - Abnormal; Notable for the following components:      Result Value   Potassium 3.4 (*)    Glucose, Bld 124 (*)    All other components within normal limits  URINALYSIS, ROUTINE W REFLEX MICROSCOPIC - Abnormal; Notable for the following components:   Color, Urine YELLOW (*)    APPearance HAZY (*)    Hgb urine dipstick SMALL (*)    Protein, ur 30 (*)    Leukocytes,Ua TRACE (*)    All other components within normal limits  CBC WITH DIFFERENTIAL/PLATELET    _____________________________________  RADIOLOGY I, [place] Bowler, personally viewed and evaluated these images (plain radiographs) as part of my medical decision making, as well as reviewing the written report by the radiologist.  ED MD interpretation: Sigmoid diverticulitis with narrowing at the sigmoid renal junction large fecal ball in the rectum noted.  Official radiology report(s): Ct Abdomen Pelvis W Contrast  Result Date: 10/07/2018 CLINICAL DATA:  Generalized lower abdominal pain EXAM: CT ABDOMEN AND PELVIS WITH CONTRAST TECHNIQUE: Multidetector CT imaging of the abdomen and pelvis was performed using the standard protocol following bolus administration of intravenous contrast. CONTRAST:  147mL OMNIPAQUE IOHEXOL 300 MG/ML  SOLN COMPARISON:  None. FINDINGS: Lower chest: The visualized heart size within normal limits. No pericardial fluid/thickening. No hiatal hernia. The visualized portions of the  lungs are clear. Hepatobiliary: The liver is normal in density without focal abnormality.The main portal vein is patent. No evidence of calcified gallstones, gallbladder wall thickening or biliary dilatation. Pancreas: Unremarkable. No pancreatic ductal dilatation or surrounding inflammatory changes. Spleen: Normal in size without focal abnormality. Adrenals/Urinary Tract: Both adrenal glands appear normal. The kidneys and collecting system appear normal without evidence of urinary tract calculus or hydronephrosis. Bladder is unremarkable. Stomach/Bowel: The stomach and small bowel are normal in appearance. There scattered sigmoid colonic diverticula with question of mild surrounding mesenteric fat stranding changes. No pericolonic free fluid or abscess. There is a gradual area of narrowing seen at the sigmoid rectal junction best seen on series 2, image 59. There is a markedly dilated rectum with a packed it fecal ball. Vascular/Lymphatic: There are no enlarged mesenteric, retroperitoneal, or  pelvic lymph nodes. Scattered aortic atherosclerotic calcifications are seen without aneurysmal dilatation. Reproductive: The patient is status post hysterectomy. No adnexal masses or collections seen. Other: No evidence of abdominal wall mass or hernia. Musculoskeletal: No acute or significant osseous findings. IMPRESSION: 1. Question of mild sigmoid colonic diverticulitis with a gradual area of narrowing seen at the sigmoid rectal junction. Would recommend direct visualization on resolution of symptoms. 2. Dilated rectum with a large fecal ball. Electronically Signed   By: Prudencio Pair M.D.   On: 10/07/2018 02:10      Procedures   ____________________________________________   INITIAL IMPRESSION / MDM / [place] / ED COURSE  As part of my medical decision making, I reviewed the following data within the [place] NUMBER   84 year old female presented with above-stated history and physical exam concerning for possible diverticulitis and as such CT scan of the abdomen was performed which confirmed sigmoid diverticulitis with large fecal stool burden in the rectum.  Patient given Augmentin in the emergency department patient also given a lactulose.  Patient be prescribed Augmentin for home spoke with the patient and her daughter at length regarding diverticulitis including warning signs that would warrant immediate return to the emergency department.  _______________________  FINAL CLINICAL IMPRESSION(S) / ED DIAGNOSES  Final diagnoses:  Sigmoid diverticulitis  Fecal impaction ([place])     MEDICATIONS GIVEN DURING THIS VISIT:  Medications  iohexol (OMNIPAQUE) 9 MG/ML oral solution 500 mL (500 mLs Oral Contrast Given 10/07/18 0000)  morphine 2 MG/ML injection 2 mg (2 mg Intravenous Given 10/07/18 0042)  iohexol (OMNIPAQUE) 300 MG/ML solution 100 mL (100 mLs Intravenous Contrast Given 10/07/18 0116)  sodium phosphate (FLEET) 7-19 GM/118ML enema 1 enema (1 enema Rectal  Given 10/07/18 0313)  lactulose (CHRONULAC) 10 GM/15ML solution 20 g (20 g Oral Given 10/07/18 0319)  amoxicillin-clavulanate (AUGMENTIN) 875-125 MG per tablet 1 tablet (1 tablet Oral Given 10/07/18 0319)     ED Discharge Orders         Ordered    amoxicillin-clavulanate (AUGMENTIN) 875-125 MG tablet  2 times daily     10/07/18 0241          *Please note:  Terrill Mohr SHIKIA EDOUARD was evaluated in Emergency Department on 10/07/2018 for the symptoms described in the history of present illness. She was evaluated in the context of the global COVID-19 pandemic, which necessitated consideration that the patient might be at risk for infection with the SARS-CoV-2 virus that causes COVID-19. Institutional protocols and algorithms that pertain to the evaluation of patients at risk for COVID-19 are in a state of rapid change based on information released by regulatory bodies including the  CDC and federal and [employer]. These policies and algorithms were followed during the patient's care in the ED.  Some ED evaluations and interventions may be delayed as a result of limited staffing during the pandemic.*  Note:  This document was prepared using Dragon voice recognition software and may include unintentional dictation errors.   Gregor Hams, MD 10/07/18 [phone]

## 2018-10-06 NOTE — ED Triage Notes (Signed)
Patient reports symptoms since Wednesday.  Reports rectal pain and difficulty urinating.  Patient report normal bowel movement on Tuesday.

## 2018-10-07 ENCOUNTER — Emergency Department: Payer: Medicare Other

## 2018-10-07 DIAGNOSIS — K5732 Diverticulitis of large intestine without perforation or abscess without bleeding: Secondary | ICD-10-CM | POA: Diagnosis not present

## 2018-10-07 IMAGING — CT CT ABD-PELV W/ CM
2 of 5 series · 16 of 46 positions shown, 18 images · IV contrast (omnipaque)
Comparison: None.

CLINICAL DATA: Generalized lower abdominal pain

EXAM:
CT ABDOMEN AND PELVIS WITH CONTRAST
TECHNIQUE: Multidetector CT imaging of the abdomen and pelvis was performed
using the standard protocol following bolus administration of
intravenous contrast.
CONTRAST:  100mL OMNIPAQUE IOHEXOL 300 MG/ML  SOLN

[Series 2: routine abd/pel with · axial · 0.91mm/px · z∈[-977,-562]mm · 13 of 93 slices shown, 15 images]
[im 5/93  soft-tissue]
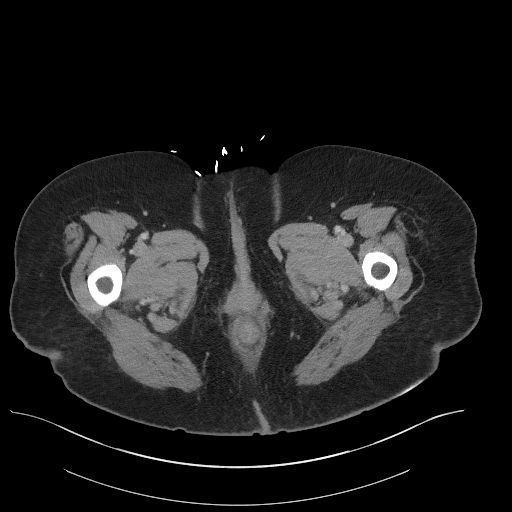
[im 5/93  bone]
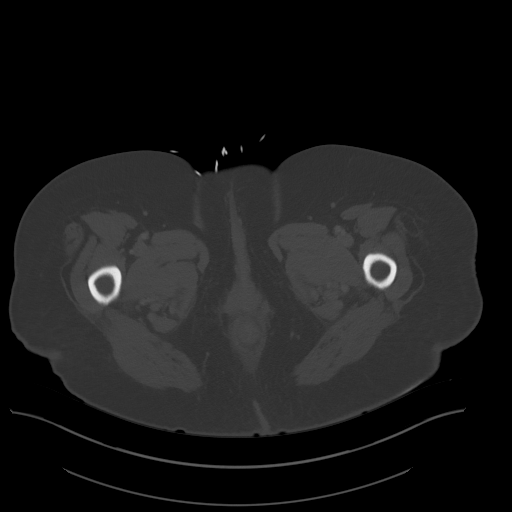
[im 15/93  soft-tissue]
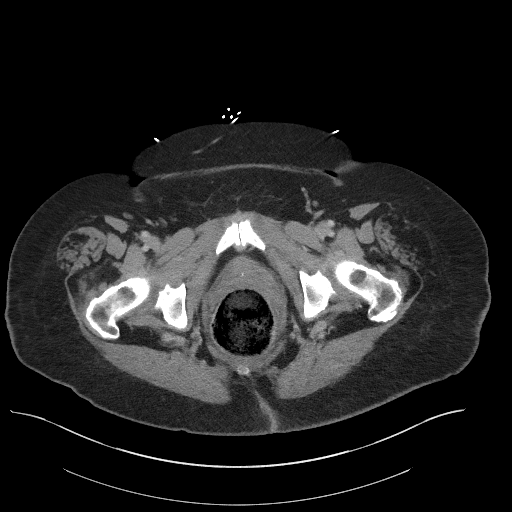
[im 20/93  soft-tissue]
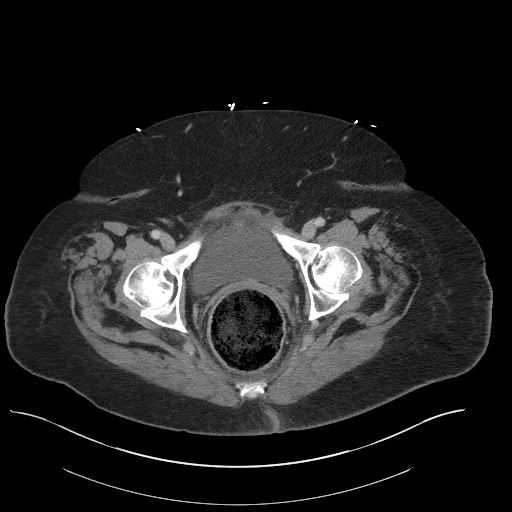
[im 25/93  soft-tissue]
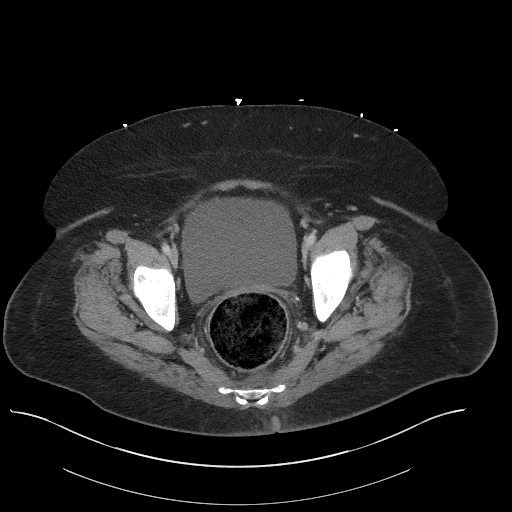
[im 34/93  soft-tissue]
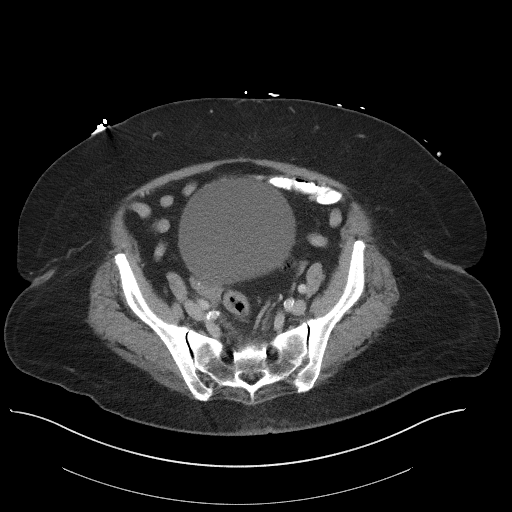
[im 39/93  soft-tissue]
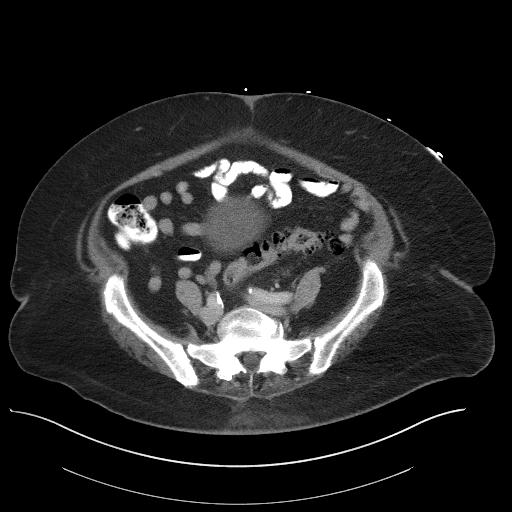
[im 49/93  soft-tissue]
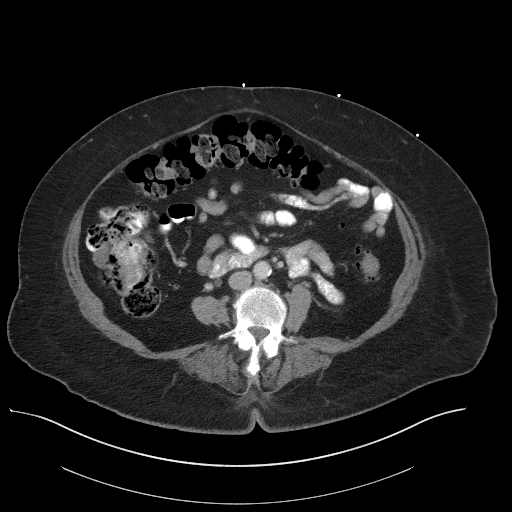
[im 54/93  soft-tissue]
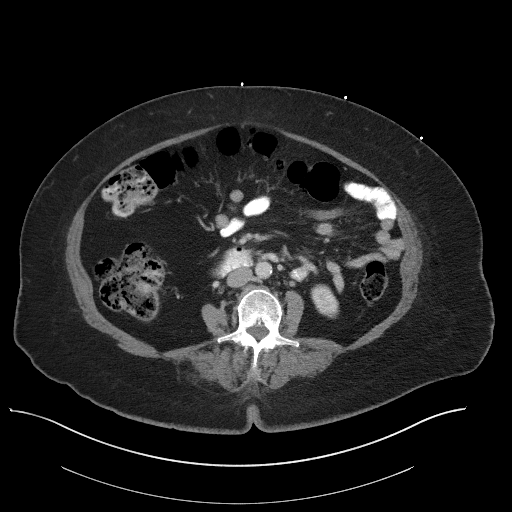
[im 59/93  soft-tissue]
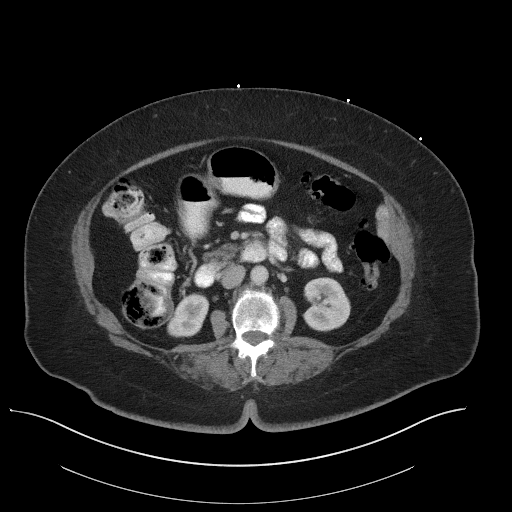
[im 59/93  bone]
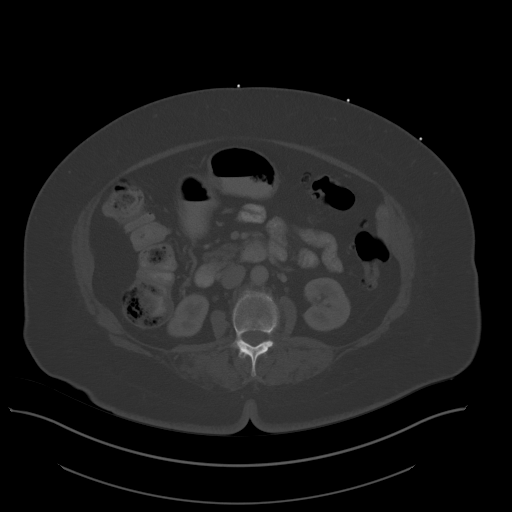
[im 68/93  soft-tissue]
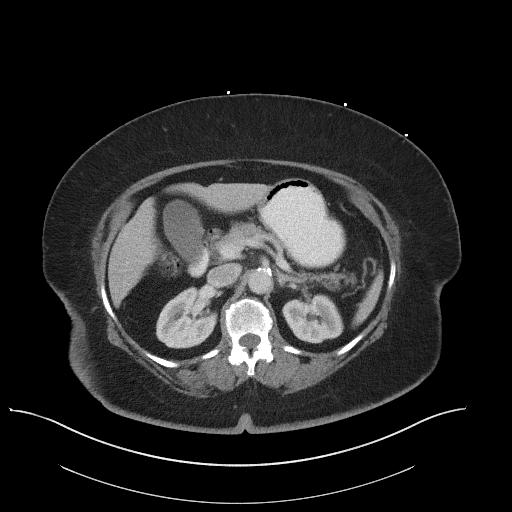
[im 73/93  soft-tissue]
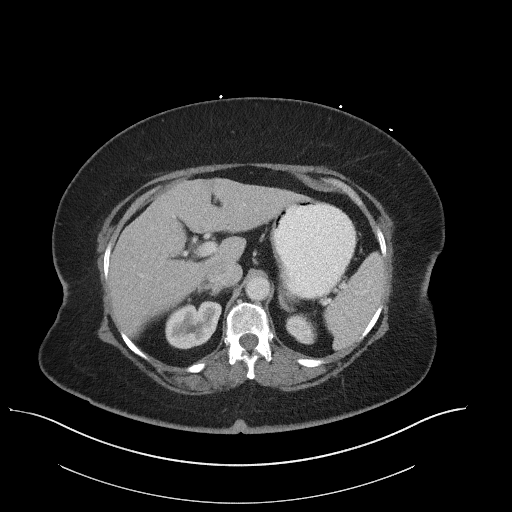
[im 78/93  soft-tissue]
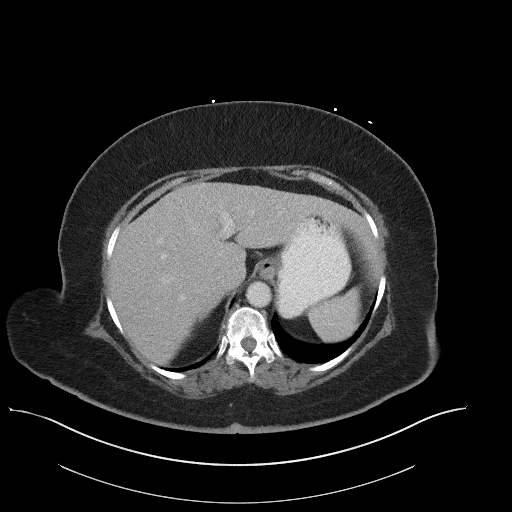
[im 88/93  soft-tissue]
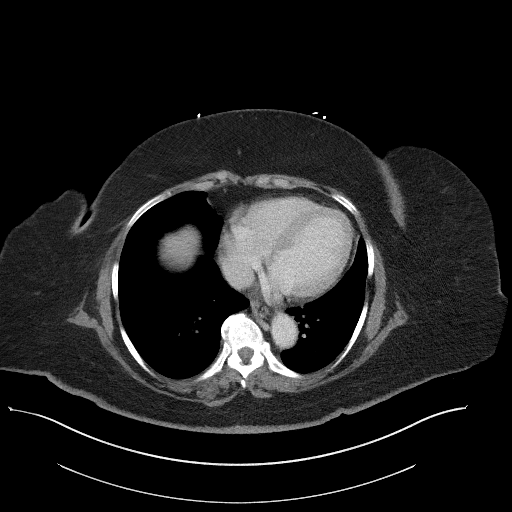

[Series 5: coronal st · coronal · 0.86mm/px · 3 of 102 slices shown]
[im 34/102  soft-tissue]
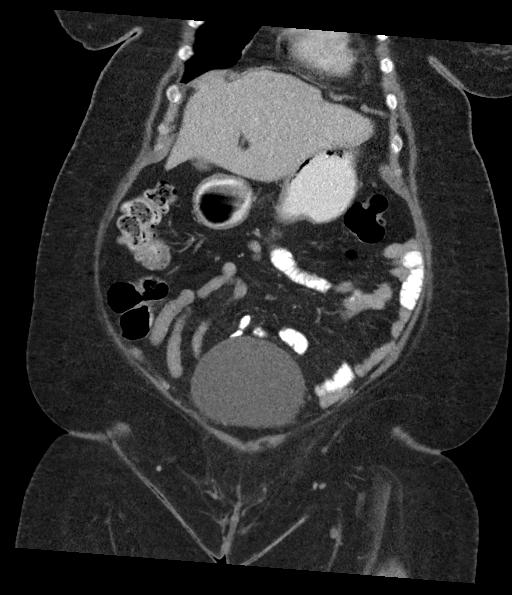
[im 45/102  soft-tissue]
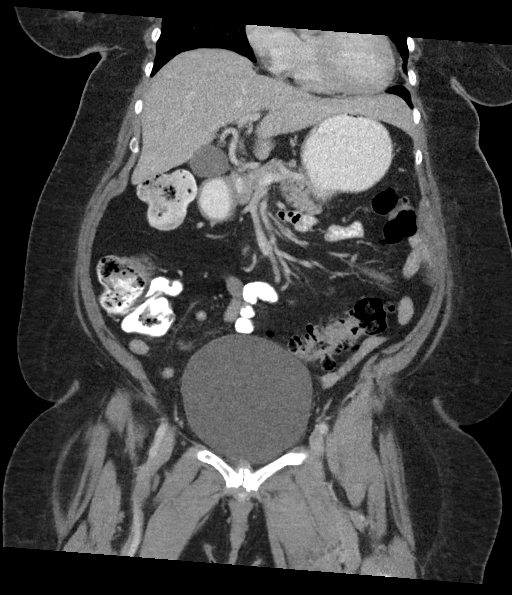
[im 57/102  soft-tissue]
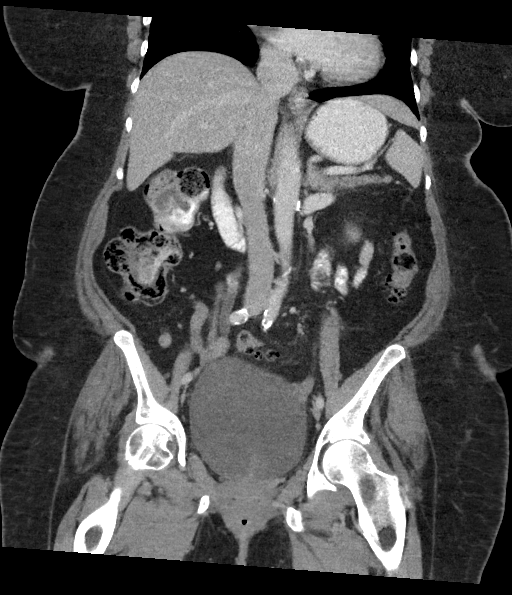

[16 of 46 positions shown; findings below may reference images not displayed]

FINDINGS: Lower chest: The visualized heart size within normal limits. No
pericardial fluid/thickening.

No hiatal hernia.

The visualized portions of the lungs are clear.

Hepatobiliary: The liver is normal in density without focal
abnormality.The main portal vein is patent. No evidence of calcified
gallstones, gallbladder wall thickening or biliary dilatation.

Pancreas: Unremarkable. No pancreatic ductal dilatation or
surrounding inflammatory changes.

Spleen: Normal in size without focal abnormality.

Adrenals/Urinary Tract: Both adrenal glands appear normal. The
kidneys and collecting system appear normal without evidence of
urinary tract calculus or hydronephrosis. Bladder is unremarkable.

Stomach/Bowel: The stomach and small bowel are normal in appearance.
There scattered sigmoid colonic diverticula with question of mild
surrounding mesenteric fat stranding changes. No pericolonic free
fluid or abscess. There is a gradual area of narrowing seen at the
sigmoid rectal junction best seen on series 2, image 59. There is a
markedly dilated rectum with a packed it fecal ball.

Vascular/Lymphatic: There are no enlarged mesenteric,
retroperitoneal, or pelvic lymph nodes. Scattered aortic
atherosclerotic calcifications are seen without aneurysmal
dilatation.

Reproductive: The patient is status post hysterectomy. No adnexal
masses or collections seen.

Other: No evidence of abdominal wall mass or hernia.

Musculoskeletal: No acute or significant osseous findings.
IMPRESSION: 1. Question of mild sigmoid colonic diverticulitis with a gradual
area of narrowing seen at the sigmoid rectal junction. Would
recommend direct visualization on resolution of symptoms.
2. Dilated rectum with a large fecal ball.

## 2018-10-07 MED ORDER — FLEET ENEMA 7-19 GM/118ML RE ENEM
1.0000 | ENEMA | Freq: Once | RECTAL | Status: AC
  Administered 2018-10-07: 1 via RECTAL

## 2018-10-07 MED ORDER — IOHEXOL 300 MG/ML  SOLN
100.0000 mL | Freq: Once | INTRAMUSCULAR | Status: AC | PRN
  Administered 2018-10-07: 100 mL via INTRAVENOUS

## 2018-10-07 MED ORDER — AMOXICILLIN-POT CLAVULANATE 875-125 MG PO TABS
1.0000 | ORAL_TABLET | Freq: Once | ORAL | Status: AC
  Administered 2018-10-07: 1 via ORAL
  Filled 2018-10-07: qty 1

## 2018-10-07 MED ORDER — MORPHINE SULFATE (PF) 2 MG/ML IV SOLN
2.0000 mg | Freq: Once | INTRAVENOUS | Status: AC
  Administered 2018-10-07: 01:00:00 2 mg via INTRAVENOUS

## 2018-10-07 MED ORDER — MORPHINE SULFATE (PF) 2 MG/ML IV SOLN
INTRAVENOUS | Status: AC
  Administered 2018-10-07: 2 mg via INTRAVENOUS
  Filled 2018-10-07: qty 1

## 2018-10-07 MED ORDER — AMOXICILLIN-POT CLAVULANATE 875-125 MG PO TABS: 1.0000 | ORAL_TABLET | Freq: Two times a day (BID) | ORAL | 0 refills | Status: AC

## 2018-10-07 MED ORDER — LACTULOSE 10 GM/15ML PO SOLN
20.0000 g | Freq: Once | ORAL | Status: AC
  Administered 2018-10-07: 20 g via ORAL
  Filled 2018-10-07: qty 30

## 2018-10-07 NOTE — ED Notes (Signed)
Assisted Dr. Owens Shark with external rectal exam

## 2019-01-04 ENCOUNTER — Other Ambulatory Visit: Payer: Self-pay | Admitting: Internal Medicine

## 2019-01-04 DIAGNOSIS — G8929 Other chronic pain: Secondary | ICD-10-CM

## 2019-01-15 ENCOUNTER — Ambulatory Visit
Admission: RE | Admit: 2019-01-15 | Discharge: 2019-01-15 | Disposition: A | Discharge status: Home / Self Care | Payer: Medicare Other | Source: Ambulatory Visit | Attending: Internal Medicine | Admitting: Internal Medicine

## 2019-01-15 ENCOUNTER — Other Ambulatory Visit: Payer: Self-pay

## 2019-01-15 DIAGNOSIS — M5441 Lumbago with sciatica, right side: Secondary | ICD-10-CM | POA: Insufficient documentation

## 2019-01-15 DIAGNOSIS — G8929 Other chronic pain: Secondary | ICD-10-CM | POA: Insufficient documentation

## 2019-01-15 IMAGING — MR MR LUMBAR SPINE W/O CM
5 series · 30 of 48 positions shown · non-contrast
Comparison: CT abdomen and pelvis [DATE].

CLINICAL DATA: Low back and bilateral leg pain for 2 months.

EXAM:
MRI LUMBAR SPINE WITHOUT CONTRAST
TECHNIQUE: Multiplanar, multisequence MR imaging of the lumbar spine was
performed. No intravenous contrast was administered.

[Series 5: T2 · sagittal · 4.0mm · 0.81mm/px · 6 of 17 slices shown (1 of 2)]
[im 1/17]
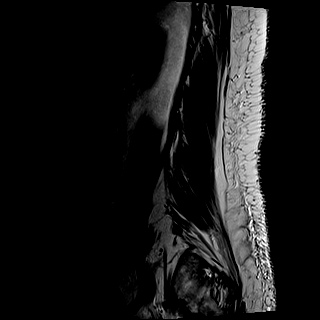
[im 4/17]
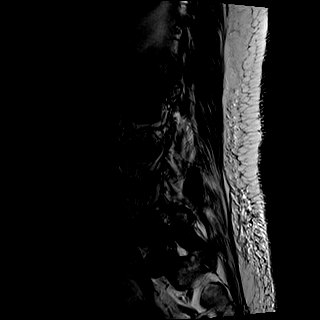
[im 7/17]
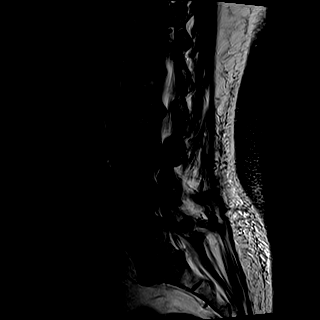
[im 10/17]
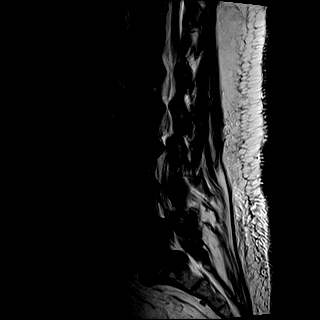
[im 13/17]
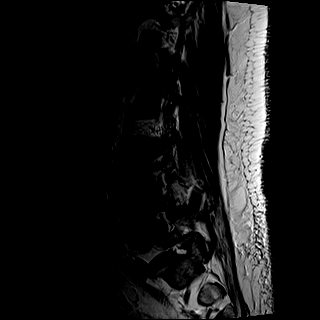
[im 17/17]
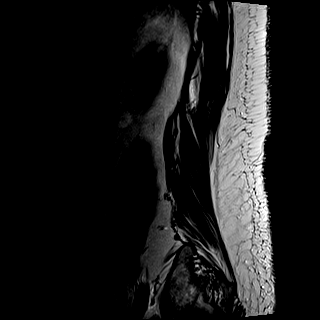

[Series 6: T1 · sagittal · 4.0mm · 0.81mm/px · 7 of 17 slices shown (1 of 2)]
[im 1/17]
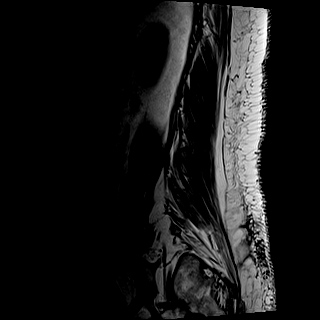
[im 3/17]
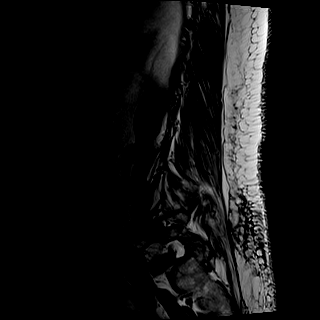
[im 6/17]
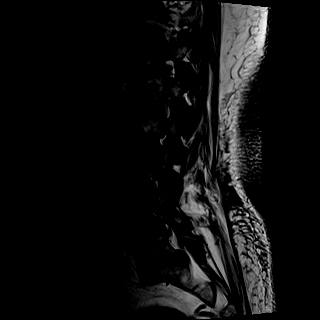
[im 9/17]
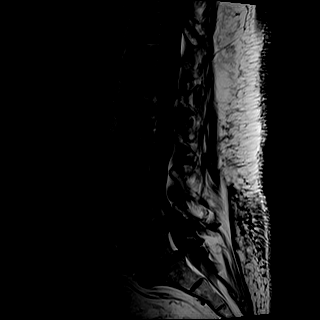
[im 11/17]
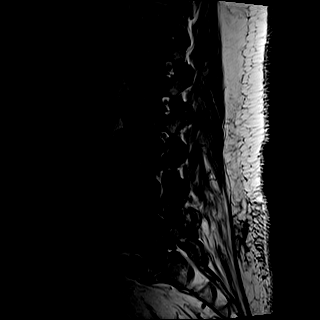
[im 14/17]
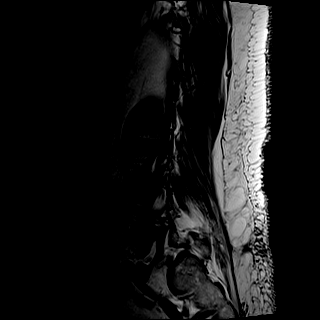
[im 17/17]
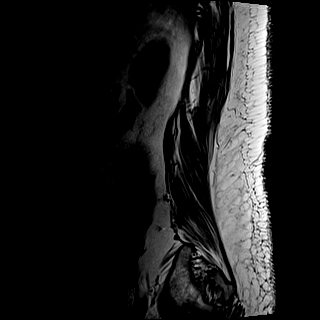

[Series 7: STIR · sagittal · 4.0mm · 0.41mm/px · 1 of 17 slices shown]
[im 1/17]
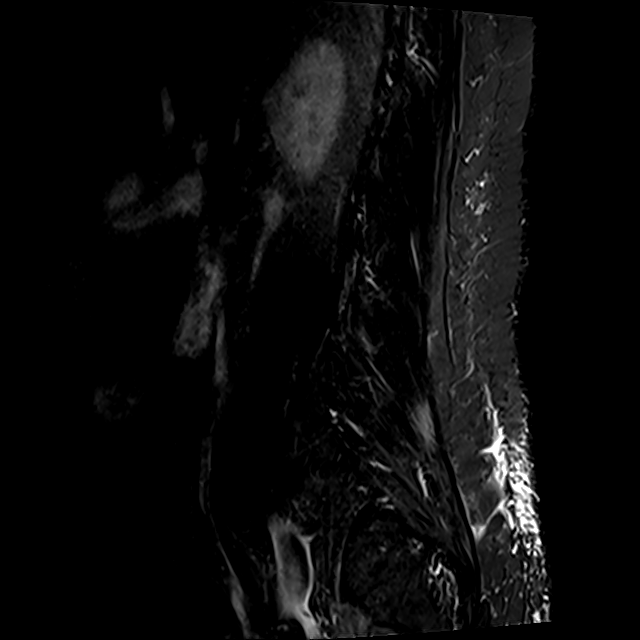

[Series 8: T2 · axial · 4.0mm · 0.78mm/px · z∈[-63,+125]mm · 8 of 34 slices shown (2 of 2)]
[im 1/34]
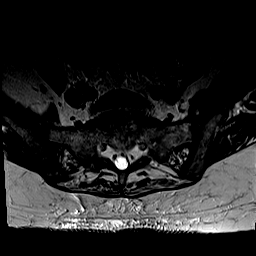
[im 6/34]
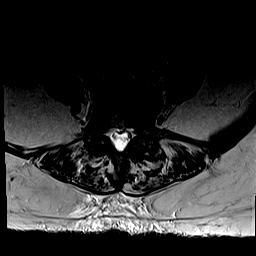
[im 11/34]
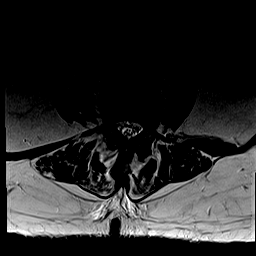
[im 16/34]
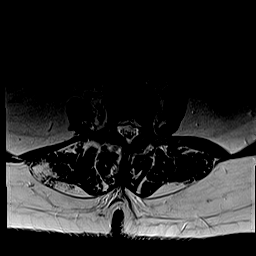
[im 18/34]
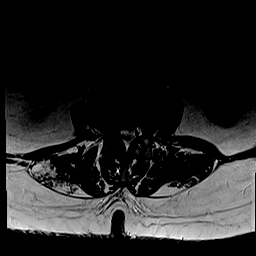
[im 23/34]
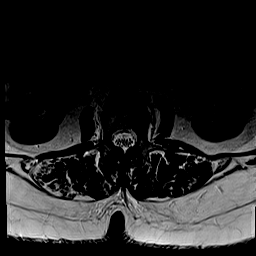
[im 28/34]
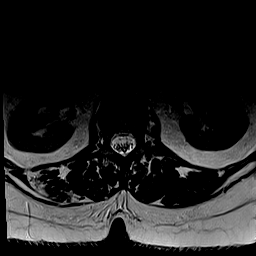
[im 34/34]
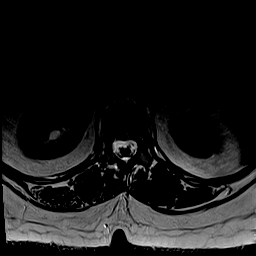

[Series 9: T1 · axial · 4.0mm · 0.39mm/px · z∈[-63,+125]mm · 8 of 34 slices shown (2 of 2)]
[im 1/34]
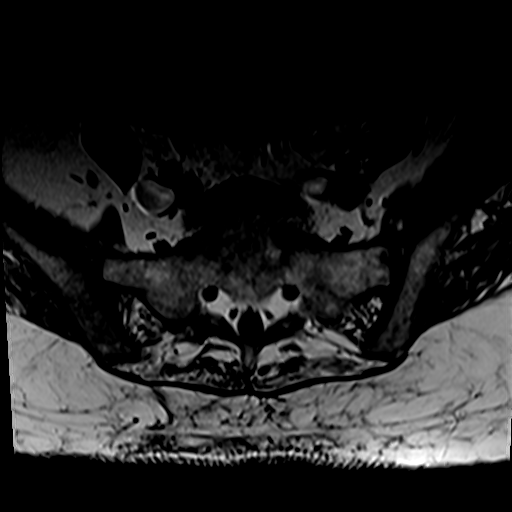
[im 6/34]
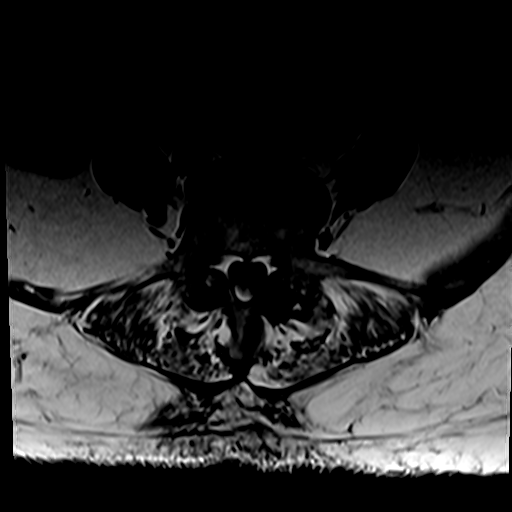
[im 11/34]
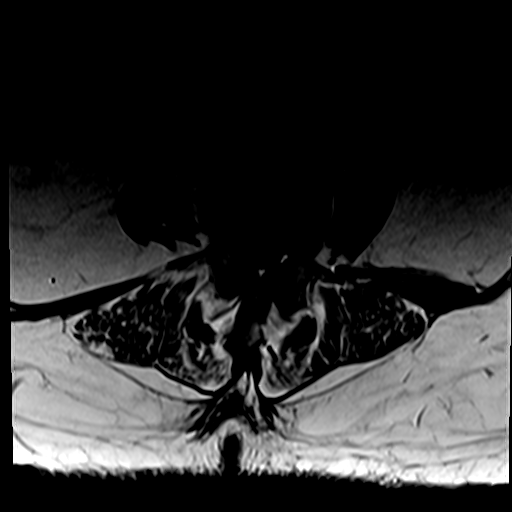
[im 16/34]
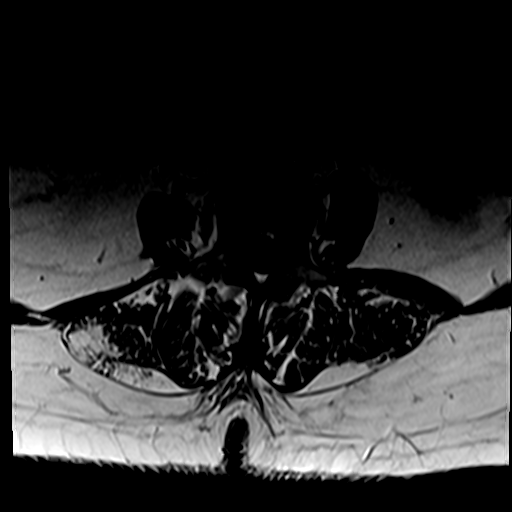
[im 18/34]
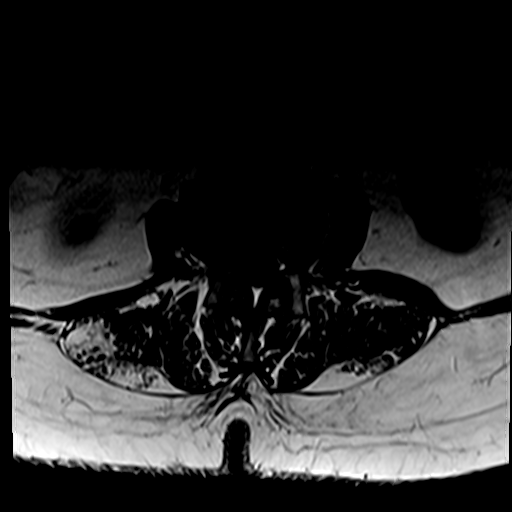
[im 23/34]
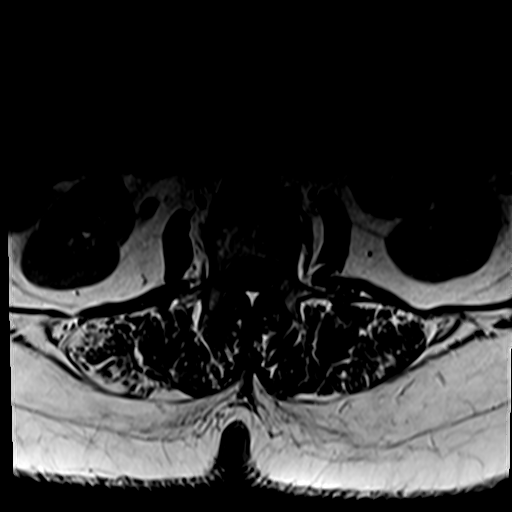
[im 28/34]
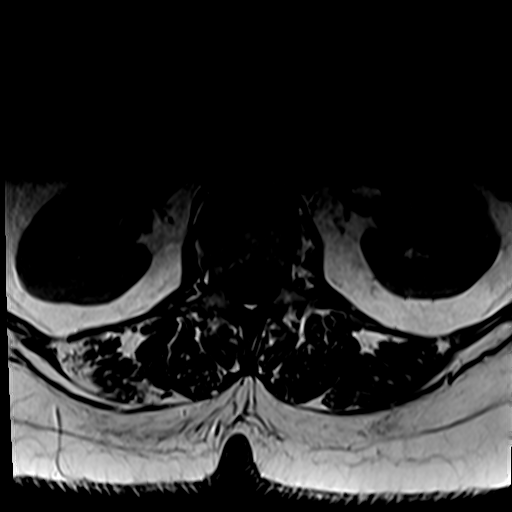
[im 34/34]
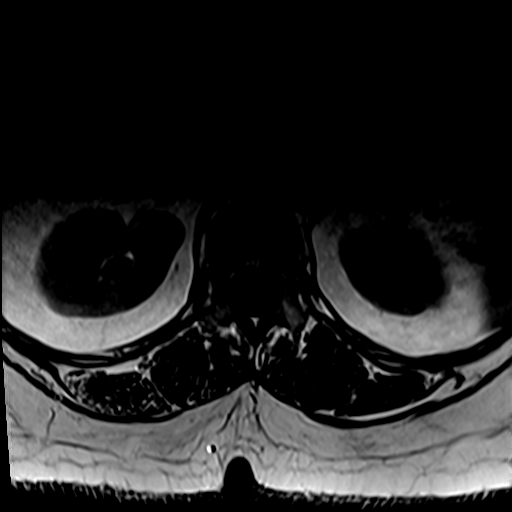

[30 of 48 positions shown; findings below may reference images not displayed]

FINDINGS: Segmentation:  Standard.

Alignment: There is 0.2 cm retrolisthesis L2 on L3, 0.3 cm
anterolisthesis L4 on L5 and 0.4 cm anterolisthesis L5 on S1.

Vertebrae:  No fracture, evidence of discitis, or bone lesion.

Conus medullaris and cauda equina: Conus extends to the T12-L1
level. Conus and cauda equina appear normal.

Paraspinal and other soft tissues: Negative.

Disc levels:

T10-11 and T11-12 are imaged in the sagittal plane only. There is a
shallow disc bulge at T10-11. A central disc extrusion with cephalad
extension is seen at T11-12. The central canal and foramina appear
open at both levels.

T12-L1: Shallow disc bulge and mild-to-moderate facet degenerative
change. No stenosis

L1-2: Mild-to-moderate facet arthropathy. There is a broad-based
right paracentral protrusion and mild central canal stenosis. The
foramina remain open.

L2-3: Broad-based disc bulge causes moderate central canal stenosis
and narrowing in the subarticular recesses. There is also mild
bilateral foraminal narrowing.

L3-4: Moderate facet arthropathy, ligamentum flavum thickening and a
diffuse broad-based disc bulge. There is moderate to moderately
severe central canal stenosis and right much worse than left
subarticular recess narrowing. The left foramen is open. Mild right
foraminal narrowing noted.

L4-5: Advanced facet degenerative disease, bulky ligamentum flavum
thickening and a large broad-based central protrusion cause severe
central canal and bilateral subarticular recess stenosis. Mild
bilateral foraminal narrowing is present.

L5-S1: Advanced bilateral facet degenerative change. The right
facets are ankylosed. The disc is uncovered with a shallow bulge but
the central canal and right foramen are open. Mild to moderate left
foraminal narrowing noted.
IMPRESSION: 1. Spondylosis worst at L4-5 where there is severe central canal and
bilateral subarticular recess stenosis.
2. Moderate to moderately severe central canal stenosis and right
worse than left subarticular recess narrowing at L3-4 where there is
also mild right foraminal narrowing.
3. Moderate central canal stenosis and narrowing in the subarticular
recesses at L2-3 where there is a broad-based disc bulge.
4. Advanced facet degenerative change at L5-S1 where there is mild
to moderate left foraminal narrowing.

## 2019-01-29 ENCOUNTER — Other Ambulatory Visit: Payer: Self-pay

## 2019-01-29 ENCOUNTER — Encounter: Payer: Self-pay | Admitting: Oncology

## 2019-01-29 ENCOUNTER — Inpatient Hospital Stay: Payer: Medicare Other | Attending: Oncology | Admitting: Oncology

## 2019-01-29 VITALS — BP 138/91 | HR 52 | Temp 97.9°F | Resp 16 | Wt 210.7 lb

## 2019-01-29 DIAGNOSIS — I10 Essential (primary) hypertension: Secondary | ICD-10-CM | POA: Diagnosis not present

## 2019-01-29 DIAGNOSIS — M25512 Pain in left shoulder: Secondary | ICD-10-CM | POA: Diagnosis not present

## 2019-01-29 DIAGNOSIS — M79642 Pain in left hand: Secondary | ICD-10-CM | POA: Diagnosis not present

## 2019-01-29 DIAGNOSIS — Z803 Family history of malignant neoplasm of breast: Secondary | ICD-10-CM | POA: Insufficient documentation

## 2019-01-29 DIAGNOSIS — N6310 Unspecified lump in the right breast, unspecified quadrant: Secondary | ICD-10-CM

## 2019-01-29 DIAGNOSIS — Z79899 Other long term (current) drug therapy: Secondary | ICD-10-CM | POA: Diagnosis not present

## 2019-01-29 DIAGNOSIS — M810 Age-related osteoporosis without current pathological fracture: Secondary | ICD-10-CM | POA: Diagnosis not present

## 2019-01-29 DIAGNOSIS — N61 Mastitis without abscess: Secondary | ICD-10-CM | POA: Diagnosis present

## 2019-01-29 DIAGNOSIS — M25511 Pain in right shoulder: Secondary | ICD-10-CM | POA: Diagnosis not present

## 2019-01-29 DIAGNOSIS — M79641 Pain in right hand: Secondary | ICD-10-CM | POA: Insufficient documentation

## 2019-01-29 NOTE — Progress Notes (Signed)
Patient reports having another right breast infection that was treated with antibiotics (finished last night) prescribed by PCP.  Also has a new lump in right nipple area.

## 2019-01-29 NOTE — Progress Notes (Signed)
Hematology/Oncology Consult note [facility] Telephone:([phone] [phone]   Patient Care Team: Maryland Pink, MD as PCP - General (Family Medicine)  REFERRING PROVIDER: Maryland Pink, MD  CHIEF COMPLAINTS/REASON FOR VISIT:  Follow-up for right breast infection.  HISTORY OF PRESENTING ILLNESS:   Mary Mckay is a  85 y.o.  female with PMH listed below was seen in consultation at the request of  Maryland Pink, MD  for evaluation of right breast swelling.   Patient reports that she noticed right breast pain in July 2020 and had drainage around right nipple. She took 2 rounds of antibiotics to treat breast cellulitis and pain has resolved. Today she has no breast pain or discomfort. She feels that her right breast maybe a bit swelling.  Most of her concerns today was bilateral shoulder pain, right worse than left, pain is worsen with joint movement, especially when she raises her hand to comb her hair.  Denies any personal history of cancer, denies family history of cancer.   08/09/2018 bilateral diagnostic mammogram and targeted US showed no suspicious mass or malignant type microcalcification in left breast  INTERVAL HISTORY Mary Mckay is a 85 y.o. female who has above history reviewed by me today presents for follow up visit for management of right breast swelling/infection Problems and complaints are listed below: Patient had an episode of right breast cellulitis in July 2020. Patient presents for follow-up. She reports that she recently had another episodes of right breast cellulitis/nipple drainage and was prescribed 7 days of antibiotics.  Symptom has improved after she finishes antibiotics.  She had felt a breast lump as well. Denies any fever, chills, chest pain, shortness of breath.   Review of Systems  Constitutional: Negative for appetite change, chills, fatigue and fever.  HENT:   Negative for hearing loss and voice change.     Eyes: Negative for eye problems.  Respiratory: Negative for chest tightness and cough.   Cardiovascular: Negative for chest pain.  Gastrointestinal: Negative for abdominal distention, abdominal pain and blood in stool.  Endocrine: Negative for hot flashes.  Genitourinary: Negative for difficulty urinating and frequency.   Musculoskeletal: Positive for arthralgias.  Skin: Negative for itching and rash.  Neurological: Negative for extremity weakness.  Hematological: Negative for adenopathy.  Psychiatric/Behavioral: Negative for confusion.  Right breast infection/nipple drainage.  MEDICAL HISTORY:  Past Medical History:  Diagnosis Date  . Allergic state   . Arthritis   . Cataract cortical, senile   . Diverticulitis   . Glaucoma   . Hypertension   . Osteoporosis     SURGICAL HISTORY: Past Surgical History:  Procedure Laterality Date  . ABDOMINAL HYSTERECTOMY    . BLADDER SURGERY    . BREAST BIOPSY Right early 2000s   benign  . COLONOSCOPY WITH PROPOFOL N/A 03/25/2017   Procedure: COLONOSCOPY WITH PROPOFOL;  Surgeon: [name], MD;  Location: [facility] ENDOSCOPY;  Service: Endoscopy;  Laterality: N/A;  . WRIST SURGERY     Right     SOCIAL HISTORY: Social History   Socioeconomic History  . Marital status: Married    Spouse name: Not on file  . Number of children: Not on file  . Years of education: Not on file  . Highest education level: Not on file  Occupational History  . Not on file  Tobacco Use  . Smoking status: Never Smoker  . Smokeless tobacco: Never Used  Substance and Sexual Activity  . Alcohol use: Not Currently  .  Drug use: Never  . Sexual activity: Not on file    Comment: Married   Other Topics Concern  . Not on file  Social History Narrative  . Not on file   Social Determinants of Health   Financial Resource Strain:   . Difficulty of Paying Living Expenses: Not on file  Food Insecurity:   . Worried About Charity fundraiser in the Last  Year: Not on file  . Ran Out of Food in the Last Year: Not on file  Transportation Needs:   . Lack of Transportation (Medical): Not on file  . Lack of Transportation (Non-Medical): Not on file  Physical Activity:   . Days of Exercise per Week: Not on file  . Minutes of Exercise per Session: Not on file  Stress:   . Feeling of Stress : Not on file  Social Connections:   . Frequency of Communication with Friends and Family: Not on file  . Frequency of Social Gatherings with Friends and Family: Not on file  . Attends Religious Services: Not on file  . Active Member of Clubs or Organizations: Not on file  . Attends Archivist Meetings: Not on file  . Marital Status: Not on file  Intimate Partner Violence:   . Fear of Current or Ex-Partner: Not on file  . Emotionally Abused: Not on file  . Physically Abused: Not on file  . Sexually Abused: Not on file    FAMILY HISTORY: Family History  Problem Relation Age of Onset  . Osteoporosis Mother   . Stroke Mother   . Heart attack Father   . Alcohol abuse Sister   . Breast cancer Sister   . Kidney disease Sister   . Allergic rhinitis Sister   . Diabetes Sister   . Heart attack Sister   . Stroke Sister   . Hypertension Sister   . Diabetes Brother   . Heart attack Brother   . Hypertension Brother     ALLERGIES:  is allergic to fentanyl and meloxicam.  MEDICATIONS:  Current Outpatient Medications  Medication Sig Dispense Refill  . atenolol (TENORMIN) 100 MG tablet Take 100 mg daily by mouth.    . candesartan (ATACAND) 32 MG tablet Take 32 mg daily by mouth.    . fluticasone (VERAMYST) 27.5 MCG/SPRAY nasal spray Place 2 sprays daily into the nose.    . hydrochlorothiazide (HYDRODIURIL) 25 MG tablet Take 25 mg daily by mouth.    . latanoprost (XALATAN) 0.005 % ophthalmic solution Place 1 drop into both eyes at bedtime.    Marland Kitchen linagliptin (TRADJENTA) 5 MG TABS tablet Take 5 mg daily by mouth.    Marland Kitchen NIFEdipine  (PROCARDIA-XL/ADALAT CC) 30 MG 24 hr tablet Take 30 mg daily by mouth.    . OMEGA 3-6-9 FATTY ACIDS PO Take 1,200 mg by mouth daily.    . potassium chloride (K-DUR,KLOR-CON) 10 MEQ tablet Take 10 mEq by mouth 2 (two) times daily.    . predniSONE (DELTASONE) 5 MG tablet Take 5 mg by mouth. Tapering down.  Now taking 2.5 tabs QD    . psyllium (METAMUCIL) 58.6 % packet Take 1 packet by mouth daily.    . traMADol (ULTRAM) 50 MG tablet Take 50 mg every 8 (eight) hours as needed by mouth.     No current facility-administered medications for this visit.     PHYSICAL EXAMINATION: ECOG PERFORMANCE STATUS: 1 - Symptomatic but completely ambulatory Vitals:   01/29/19 1015  BP: (!) 138/91  Pulse: (!) 52  Resp: 16  Temp: 97.9 F (36.6 C)   Filed Weights   01/29/19 1015  Weight: 210 lb 11.2 oz (95.6 kg)    Physical Exam Constitutional:      General: She is not in acute distress.    Appearance: She is obese.  HENT:     Head: Normocephalic and atraumatic.  Eyes:     General: No scleral icterus.    Pupils: Pupils are equal, round, and reactive to light.  Cardiovascular:     Rate and Rhythm: Normal rate and regular rhythm.     Heart sounds: Normal heart sounds.  Pulmonary:     Effort: Pulmonary effort is normal. No respiratory distress.     Breath sounds: No wheezing.  Abdominal:     General: Bowel sounds are normal. There is no distension.     Palpations: Abdomen is soft. There is no mass.     Tenderness: There is no abdominal tenderness.  Musculoskeletal:        General: No deformity. Normal range of motion.     Cervical back: Normal range of motion and neck supple.  Skin:    General: Skin is warm and dry.     Findings: No erythema or rash.  Neurological:     Mental Status: She is alert and oriented to person, place, and time.     Cranial Nerves: No cranial nerve deficit.     Coordination: Coordination normal.  Psychiatric:        Behavior: Behavior normal.        Thought  Content: Thought content normal.   Breast exam was performed in seated and lying down position. Right breast mild skin erythema.  Palpable mass right outer lower quadrant, approximately 8:00, 4 to 5 cm from nipple.  No nipple drainage per my examination.     LABORATORY DATA:  I have reviewed the data as listed Lab Results  Component Value Date   WBC 8.6 10/06/2018   HGB 12.4 10/06/2018   HCT 39.5 10/06/2018   MCV 90.8 10/06/2018   PLT 265 10/06/2018   Recent Labs    10/06/18 1918  NA 136  K 3.4*  CL 100  CO2 25  GLUCOSE 124*  BUN 15  CREATININE 0.61  CALCIUM 9.7  GFRNONAA >60  GFRAA >60  PROT 7.7  ALBUMIN 3.7  AST 24  ALT 29  ALKPHOS 46  BILITOT 0.7   Iron/TIBC/Ferritin/ %Sat No results found for: IRON, TIBC, FERRITIN, IRONPCTSAT    RADIOGRAPHIC STUDIES: I have personally reviewed the radiological images as listed and agreed with the findings in the report. MR LUMBAR SPINE WO CONTRAST  Result Date: 01/15/2019 CLINICAL DATA:  Low back and bilateral leg pain for 2 months. EXAM: MRI LUMBAR SPINE WITHOUT CONTRAST TECHNIQUE: Multiplanar, multisequence MR imaging of the lumbar spine was performed. No intravenous contrast was administered. COMPARISON:  CT abdomen and pelvis 10/07/2018. FINDINGS: Segmentation:  Standard. Alignment: There is 0.2 cm retrolisthesis L2 on L3, 0.3 cm anterolisthesis L4 on L5 and 0.4 cm anterolisthesis L5 on S1. Vertebrae:  No fracture, evidence of discitis, or bone lesion. Conus medullaris and cauda equina: Conus extends to the T12-L1 level. Conus and cauda equina appear normal. Paraspinal and other soft tissues: Negative. Disc levels: T10-11 and T11-12 are imaged in the sagittal plane only. There is a shallow disc bulge at T10-11. A central disc extrusion with cephalad extension is seen at T11-12. The central canal and foramina appear open at both  levels. T12-L1: Shallow disc bulge and mild-to-moderate facet degenerative change. No stenosis L1-2:  Mild-to-moderate facet arthropathy. There is a broad-based right paracentral protrusion and mild central canal stenosis. The foramina remain open. L2-3: Broad-based disc bulge causes moderate central canal stenosis and narrowing in the subarticular recesses. There is also mild bilateral foraminal narrowing. L3-4: Moderate facet arthropathy, ligamentum flavum thickening and a diffuse broad-based disc bulge. There is moderate to moderately severe central canal stenosis and right much worse than left subarticular recess narrowing. The left foramen is open. Mild right foraminal narrowing noted. L4-5: Advanced facet degenerative disease, bulky ligamentum flavum thickening and a large broad-based central protrusion cause severe central canal and bilateral subarticular recess stenosis. Mild bilateral foraminal narrowing is present. L5-S1: Advanced bilateral facet degenerative change. The right facets are ankylosed. The disc is uncovered with a shallow bulge but the central canal and right foramen are open. Mild to moderate left foraminal narrowing noted. IMPRESSION: 1. 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 2. Moderate to moderately severe central canal stenosis and right worse than left subarticular recess narrowing at L3-4 where there is also mild right foraminal narrowing. 3. Moderate central canal stenosis and narrowing in the subarticular recesses at L2-3 where there is a broad-based disc bulge. 4. Advanced facet degenerative change at L5-S1 where there is mild to moderate left foraminal narrowing. Electronically Signed   By: Inge Rise M.D.   On: 01/15/2019 15:13      ASSESSMENT & PLAN:  1. Cellulitis of breast   2. Breast mass, right    Recurrent breast cellulitis, status post a course of antibiotics.  Symptom has improved. Mammogram and targeted US 5 months ago did not show evidence of malignancy.  Patient had a recurrent breast  cellulitis, she has palpable right breast nodule. I will repeat a diagnostic mammogram with targeted ultrasound of the right breast. -If mammogram is negative, will consider MRI for further evaluation.  Orders Placed This Encounter  Procedures  . MM DIAG BREAST TOMO UNI RIGHT    Standing Status:   Future    Standing Expiration Date:   01/29/2020    Order Specific Question:   Reason for Exam (SYMPTOM  OR DIAGNOSIS REQUIRED)    Answer:   right breast lump, recurrent mastitits    Order Specific Question:   Preferred imaging location?    Answer:   [facility]  . US Breast Limited Uni Right Inc Axilla    Standing Status:   Future    Standing Expiration Date:   03/28/2020    Order Specific Question:   Reason for Exam (SYMPTOM  OR DIAGNOSIS REQUIRED)    Answer:   right breast lump, recurrent mastitis    Order Specific Question:   Preferred imaging location?    Answer:   [facility]    All questions were answered. The patient knows to call the clinic with any problems questions or concerns.   Maryland Pink, MD    Return of visit to be determined.  Earlie Server, MD, PhD Hematology Oncology [facility] Pager- [phone] 01/29/2019

## 2019-02-05 ENCOUNTER — Ambulatory Visit
Admission: RE | Admit: 2019-02-05 | Discharge: 2019-02-05 | Disposition: A | Discharge status: Home / Self Care | Payer: Medicare Other | Source: Ambulatory Visit | Attending: Oncology | Admitting: Oncology

## 2019-02-05 ENCOUNTER — Other Ambulatory Visit: Payer: Self-pay | Admitting: Oncology

## 2019-02-05 DIAGNOSIS — N61 Mastitis without abscess: Secondary | ICD-10-CM | POA: Insufficient documentation

## 2019-02-05 IMAGING — MG MM DIGITAL DIAGNOSTIC UNILAT*R* W/ TOMO W/ CAD
6 series · 6 of 18 positions shown · non-contrast
Comparison: Previous exam(s).

CLINICAL DATA: Six-month interval follow-up after treatment for
RIGHT breast mastitis.Patient states that her symptoms have
resolved.

EXAM:
DIGITAL DIAGNOSTIC UNILATERAL RIGHT MAMMOGRAM WITH CAD AND TOMO

[R MLO synth-2D (1 of 2)]
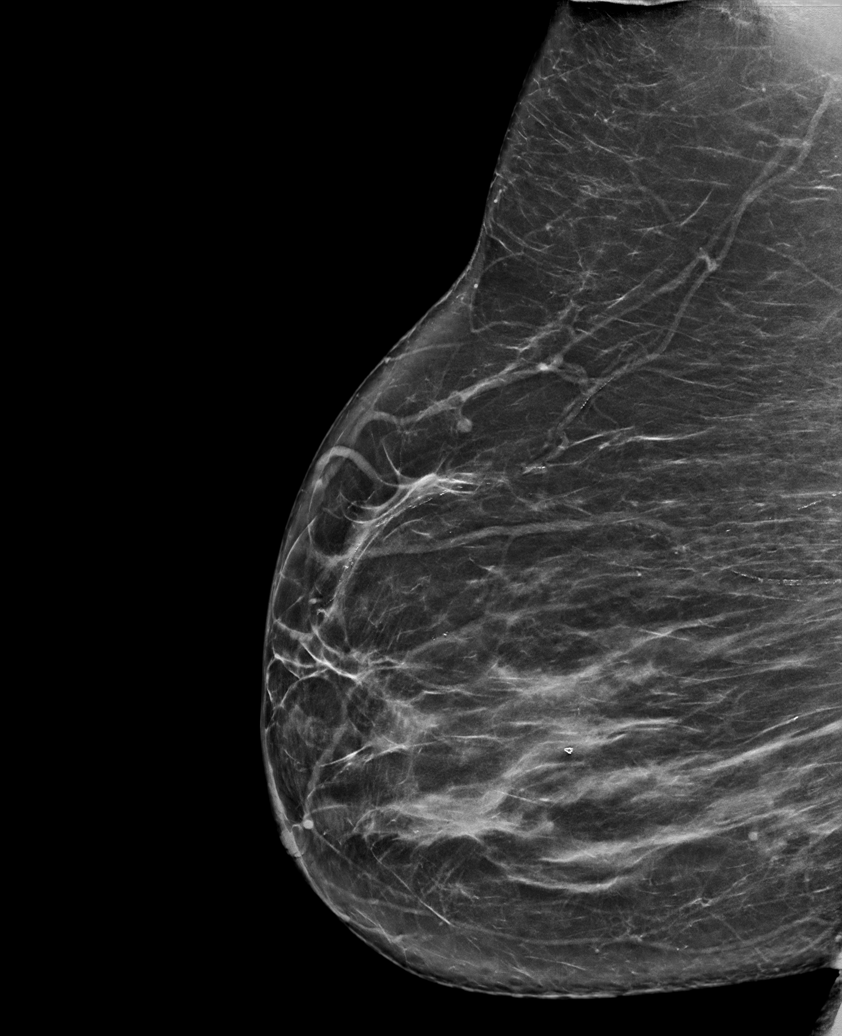

[R MLO synth-2D (2 of 2)]
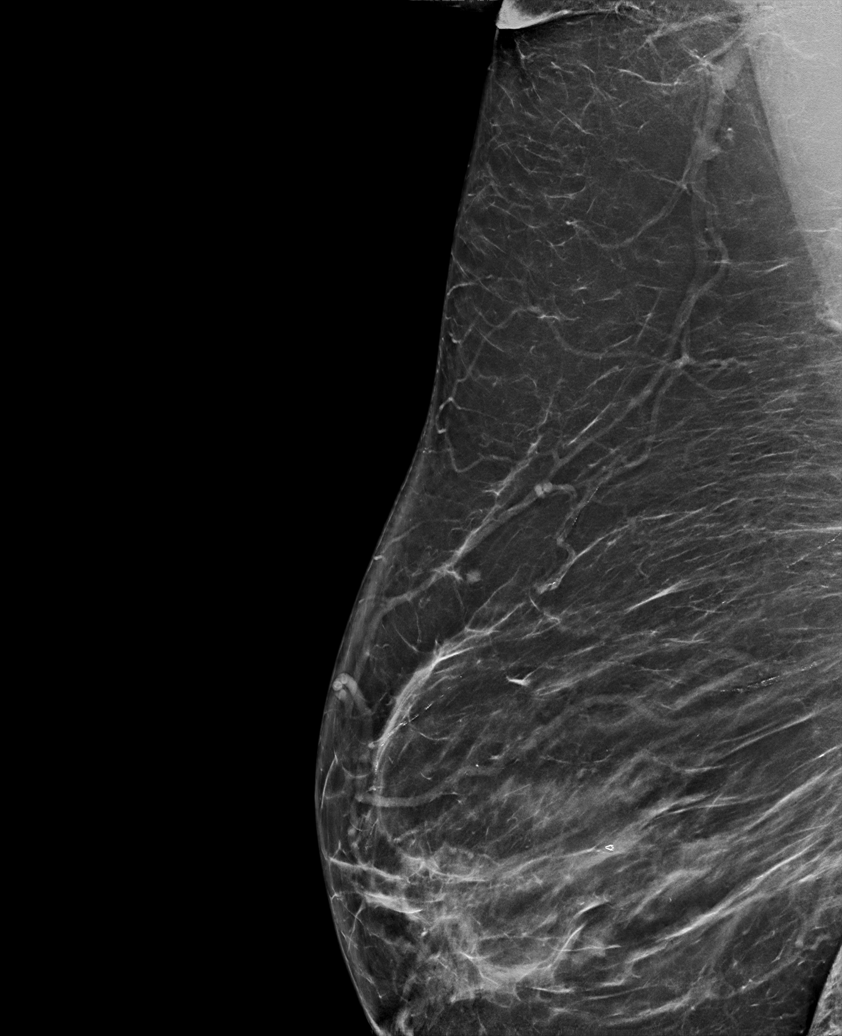

[R CC synth-2D]
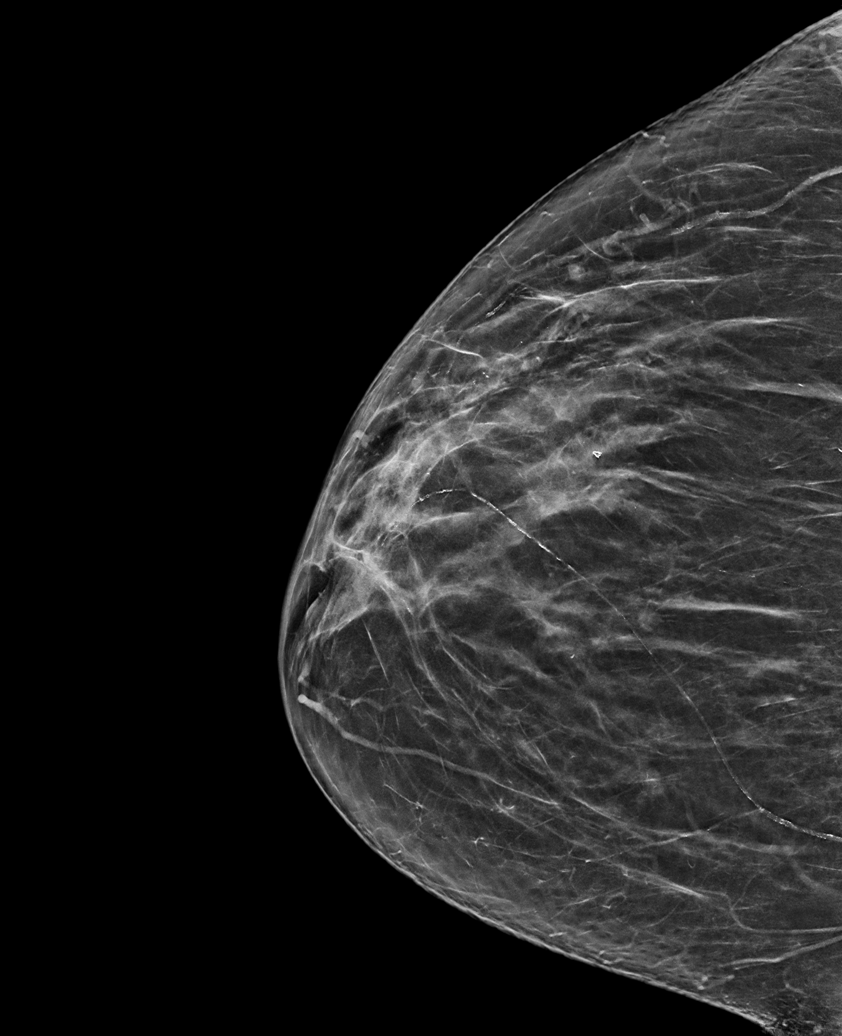

[R MLO tomo (1 of 2) · tomo slice 43/86.0]
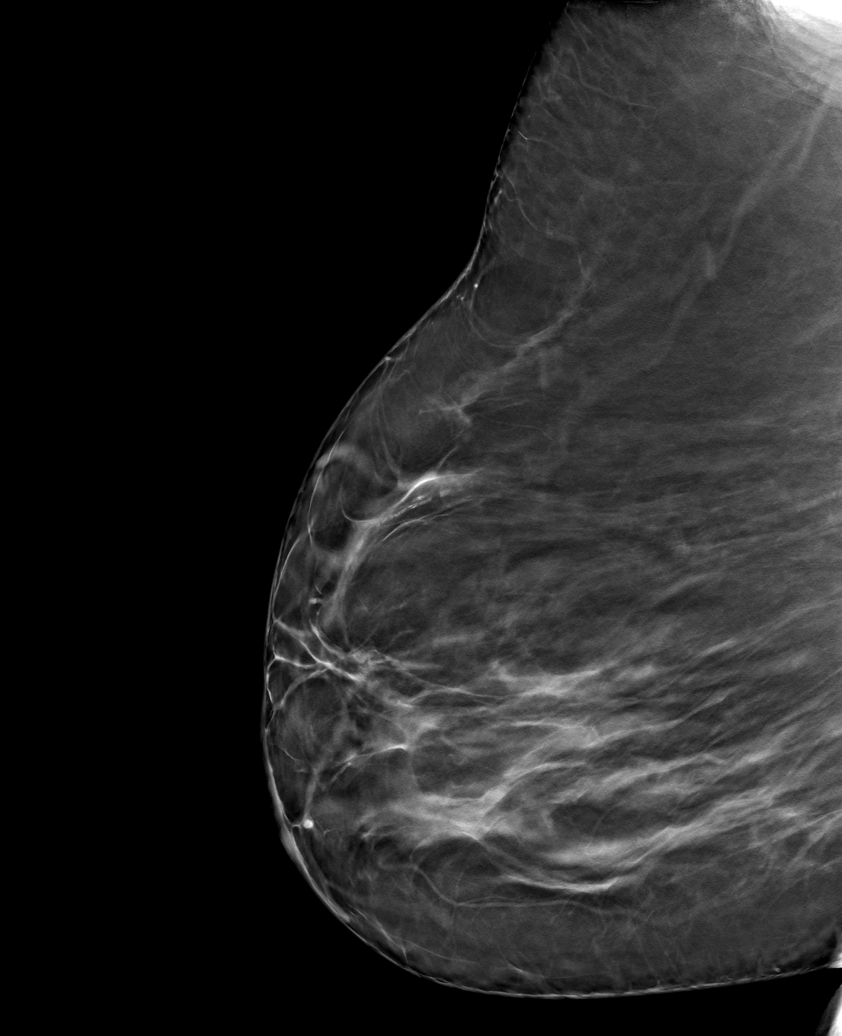

[R MLO tomo (2 of 2) · tomo slice 47/94.0]
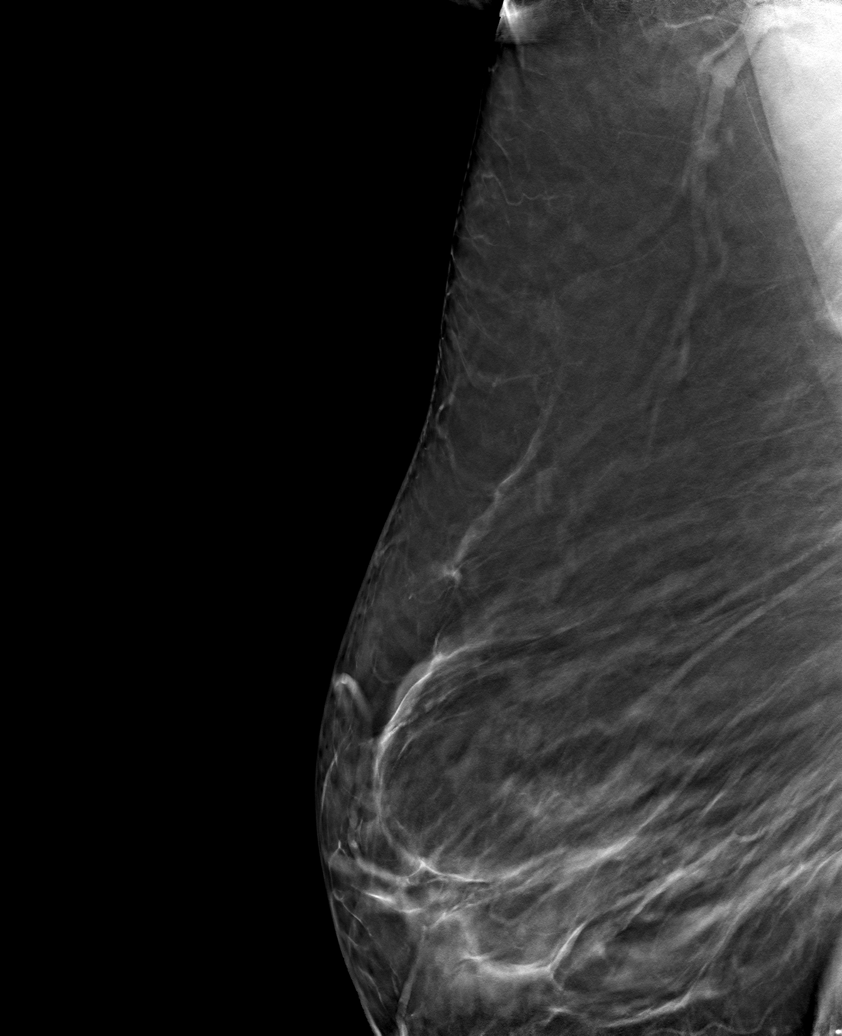

[R CC tomo · tomo slice 38/75.0]
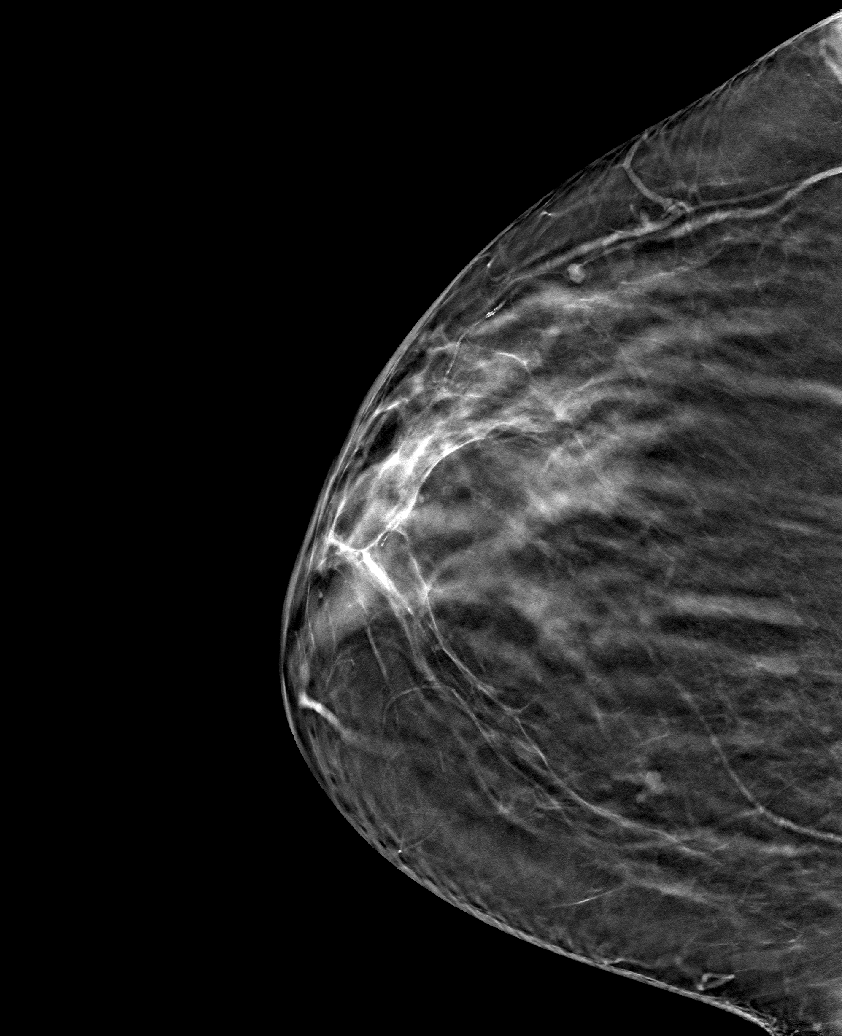

[6 of 18 positions shown; findings below may reference images not displayed]

ACR Breast Density Category b: There are scattered areas of
fibroglandular density.
FINDINGS: Tomosynthesis and synthesized full field CC and MLO views of the
RIGHT breast were obtained.

No findings suspicious for malignancy in the RIGHT breast. The focal
asymmetry involving OUTER breast has a similar appearance to
multiple prior mammograms dating back to at least [8V].

Mammographic images were processed with CAD.
IMPRESSION: No mammographic evidence of malignancy involving the RIGHT breast.

RECOMMENDATION:
Annual BILATERAL screening mammography which is due in 6 months.

I have discussed the findings and recommendations with the patient.
If applicable, a reminder letter will be sent to the patient
regarding the next appointment.

BI-RADS CATEGORY  1: Negative.

## 2019-02-06 ENCOUNTER — Telehealth: Payer: Self-pay

## 2019-02-06 DIAGNOSIS — N61 Mastitis without abscess: Secondary | ICD-10-CM

## 2019-02-06 DIAGNOSIS — N6310 Unspecified lump in the right breast, unspecified quadrant: Secondary | ICD-10-CM

## 2019-02-06 NOTE — Telephone Encounter (Signed)
Patient aware.  Will get scheduling to get the MRI scheduled and I will enter referral for Dr. Peyton Najjar

## 2019-02-06 NOTE — Telephone Encounter (Signed)
-----   Message from Earlie Server, MD sent at 02/05/2019  9:59 PM EST ----- Please let patient know that her mammogram did not show mass.  I recommend MRI breast, please schedule- breast mass/recurrent breast cellulitis.  Please refer her to establish care with Dr.Cintron.

## 2019-02-07 ENCOUNTER — Telehealth: Payer: Self-pay | Admitting: *Deleted

## 2019-02-07 NOTE — Telephone Encounter (Signed)
Called and spoke with pts daughter Kyra Manges and made her aware of her mother's sched 02/19/19  MRI appt. She's aware of the location,date and time of the appt

## 2019-02-19 ENCOUNTER — Ambulatory Visit
Admission: RE | Admit: 2019-02-19 | Discharge: 2019-02-19 | Disposition: A | Discharge status: Home / Self Care | Payer: Medicare Other | Source: Ambulatory Visit | Attending: Oncology | Admitting: Oncology

## 2019-02-19 ENCOUNTER — Other Ambulatory Visit: Payer: Self-pay

## 2019-02-19 DIAGNOSIS — N61 Mastitis without abscess: Secondary | ICD-10-CM | POA: Insufficient documentation

## 2019-02-19 DIAGNOSIS — N6310 Unspecified lump in the right breast, unspecified quadrant: Secondary | ICD-10-CM | POA: Diagnosis present

## 2019-02-19 LAB — POCT I-STAT CREATININE: Creatinine, Ser: 0.6 mg/dL (ref 0.44–1.00)

## 2019-02-19 IMAGING — MR MR BREAST BILAT WO/W CM
2 of 9 series · 6 of 48 positions shown · IV contrast (10ml Gadavist)
Comparison: Previous exam(s).

CLINICAL DATA: 84-year-old female with history of recurrent right
breast cellulitis and nipple discharge over the past 2 years.
History notes no pain or discharge at the time of examination.

LABS:  None performed on site.
EXAM:
BILATERAL BREAST MRI WITH AND WITHOUT CONTRAST
TECHNIQUE: Multiplanar, multisequence MR images of both breasts were obtained
prior to and following the intravenous administration of 10 ml of
Gadavist.

[Series 4: T1 · axial · B · 1.5mm · 1.16mm/px · z∈[-54,+125]mm · 5 of 120 slices shown]
[im 1/120]
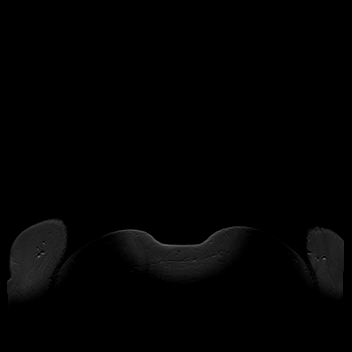
[im 30/120]
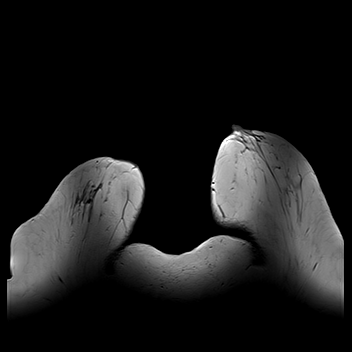
[im 60/120]
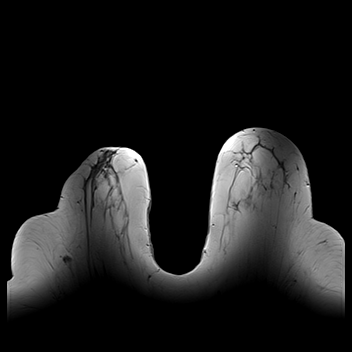
[im 90/120]
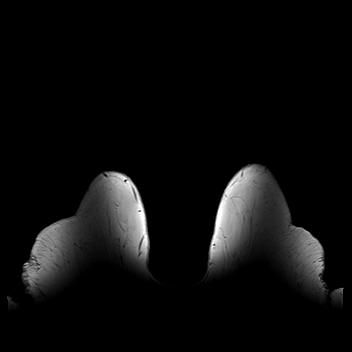
[im 120/120]
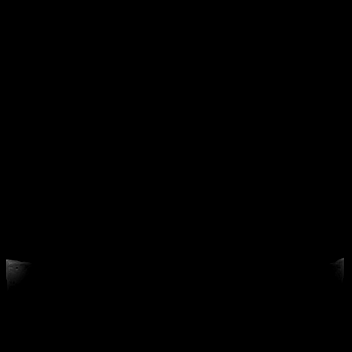

[Series 5: T2 · axial · B · 3.0mm · 1.16mm/px · 1 of 43 slices shown]
[im 1/43]
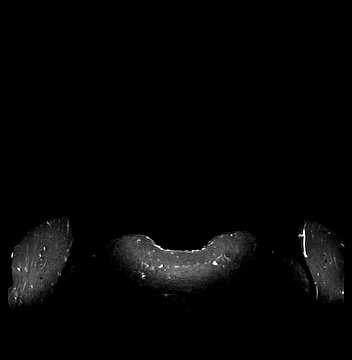

[6 of 48 positions shown; findings below may reference images not displayed]

Three-dimensional MR images were rendered by post-processing of the
original MR data on an independent workstation. The
three-dimensional MR images were interpreted, and findings are
reported in the following complete MRI report for this study. Three
dimensional images were evaluated at the independent [employer]
workstation
FINDINGS: Breast composition: b. Scattered fibroglandular tissue.

Background parenchymal enhancement: Moderate.

Right breast: The right breast appears slightly retracted compared
to the left. There is non-mass enhancement involving the entire
central aspect extending from the nipple posteriorly (series 12,
image 38/112). There is mild associated nipple retraction and
possible thickening of the periareolar skin. Overall enhancement
measures 8.3 x 5.1 cm in axial dimensions. No dominant mass
identified.

Left breast: No suspicious mass or abnormal enhancement.

Lymph nodes: No abnormal appearing lymph nodes.

Ancillary findings:  None.
IMPRESSION: 1. Indeterminate non-mass enhancement of the central right breast
with associated retraction of the nipple and possible mild
periareolar skin thickening. While these findings could potentially
be post inflammatory in nature, given that the patient has no
current clinical symptoms, recommendation is for definitive tissue
diagnosis.
2. No MRI evidence of malignancy on the left.
3. No suspicious lymphadenopathy.

RECOMMENDATION:
Single area MRI guided biopsy of right breast non-mass enhancement.

BI-RADS CATEGORY  4: Suspicious.

## 2019-02-19 MED ORDER — GADOBUTROL 1 MMOL/ML IV SOLN
10.0000 mL | Freq: Once | INTRAVENOUS | Status: AC | PRN
  Administered 2019-02-19: 10:00:00 10 mL via INTRAVENOUS

## 2019-02-23 ENCOUNTER — Telehealth: Payer: Self-pay

## 2019-02-23 ENCOUNTER — Other Ambulatory Visit: Payer: Self-pay | Admitting: *Deleted

## 2019-02-23 DIAGNOSIS — N6315 Unspecified lump in the right breast, overlapping quadrants: Secondary | ICD-10-CM

## 2019-02-23 DIAGNOSIS — N63 Unspecified lump in unspecified breast: Secondary | ICD-10-CM

## 2019-02-23 NOTE — Telephone Encounter (Signed)
-----   Message from Rico Junker, RN sent at 02/23/2019  7:26 AM EST ----- Orders are in and Samantha in the breast center will get her scheduled.  Thanks, Vita Barley ----- Message ----- From: Earlie Server, MD Sent: 02/22/2019  11:04 PM EST To: Evelina Dun, RN, Vanice Sarah, CMA, #  Please arrange patient to have right breast biopsy.  Thank you

## 2019-02-27 ENCOUNTER — Other Ambulatory Visit: Payer: Self-pay | Admitting: Oncology

## 2019-02-27 ENCOUNTER — Telehealth: Payer: Self-pay

## 2019-02-27 DIAGNOSIS — N63 Unspecified lump in unspecified breast: Secondary | ICD-10-CM

## 2019-02-27 DIAGNOSIS — N6315 Unspecified lump in the right breast, overlapping quadrants: Secondary | ICD-10-CM

## 2019-02-27 NOTE — Telephone Encounter (Signed)
-----   Message from Rico Junker, RN sent at 02/27/2019  9:19 AM EST ----- Regarding: RE: MRI biopsy You are awesome!  Thank-you!  Ellison Hughs can you call and get her scheduled please?  Thanks, Vita Barley ----- Message ----- From: Sherrie Sport Sent: 02/27/2019   8:17 AM EST To: Evelina Dun, RN, Vanice Sarah, CMA, # Subject: RE: MRI biopsy                                 Sure, I can definitely do that. Changed to GI  Thanks Sam ----- Message ----- From: Rico Junker, RN Sent: 02/27/2019   8:01 AM EST To: Evelina Dun, RN, Sherrie Sport, # Subject: RE: MRI biopsy                                 Sam, Can you change the order to [facility] or do I need to put in another order?  Thanks, Vita Barley ----- Message ----- From: Sherrie Sport Sent: 02/27/2019   7:49 AM EST To: Evelina Dun, RN, Vanice Sarah, CMA, # Subject: MRI biopsy                                     MRI guided biopsies have to be done at the [facility] in . We can't do them here at [facility] at this time.   Thanks Sam ----- Message ----- From: Rico Junker, RN Sent: 02/23/2019   7:26 AM EST To: Evelina Dun, RN, Sherrie Sport, #  Orders are in and [place] in the breast center will get her scheduled.  Thanks, Vita Barley ----- Message ----- From: Earlie Server, MD Sent: 02/22/2019  11:04 PM EST To: Evelina Dun, RN, Vanice Sarah, CMA, #  Please arrange patient to have right breast biopsy.  Thank you

## 2019-02-27 NOTE — Progress Notes (Signed)
Pt has been scheduled as requested. I will try to reach out to her to make her aware of her appt Location,date and time.

## 2019-02-27 NOTE — Telephone Encounter (Signed)
Patient has been scheduled and notified of appointment. She voiced understanding. Appts will also be mailed.

## 2019-03-09 ENCOUNTER — Other Ambulatory Visit (HOSPITAL_COMMUNITY): Payer: Self-pay | Admitting: Diagnostic Radiology

## 2019-03-09 ENCOUNTER — Ambulatory Visit
Admission: RE | Admit: 2019-03-09 | Discharge: 2019-03-09 | Disposition: A | Discharge status: Home / Self Care | Payer: Medicare Other | Source: Ambulatory Visit | Attending: Oncology | Admitting: Oncology

## 2019-03-09 ENCOUNTER — Other Ambulatory Visit: Payer: Self-pay

## 2019-03-09 DIAGNOSIS — N6315 Unspecified lump in the right breast, overlapping quadrants: Secondary | ICD-10-CM

## 2019-03-09 DIAGNOSIS — N63 Unspecified lump in unspecified breast: Secondary | ICD-10-CM

## 2019-03-09 IMAGING — MG MM BREAST LOCALIZATION CLIP
4 series · 4 of 12 positions shown · non-contrast
Comparison: Previous exam(s).

CLINICAL DATA: MRI guided biopsy was performed non mass enhancement
in the central right breast.

EXAM:
DIAGNOSTIC RIGHT MAMMOGRAM POST MRI BIOPSY

[R CC synth-2D]
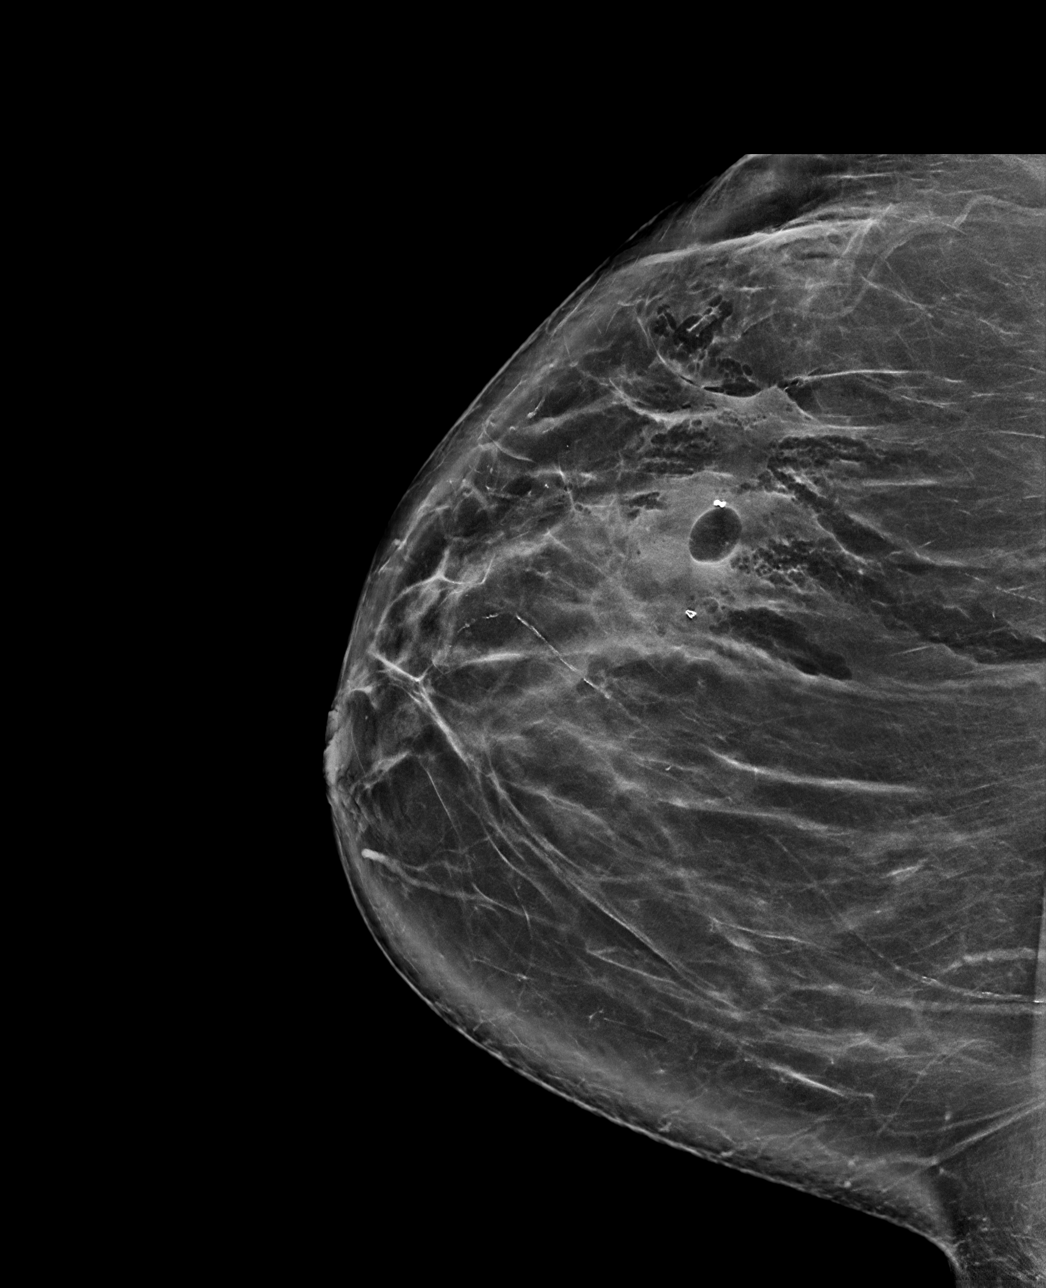

[R ML synth-2D]
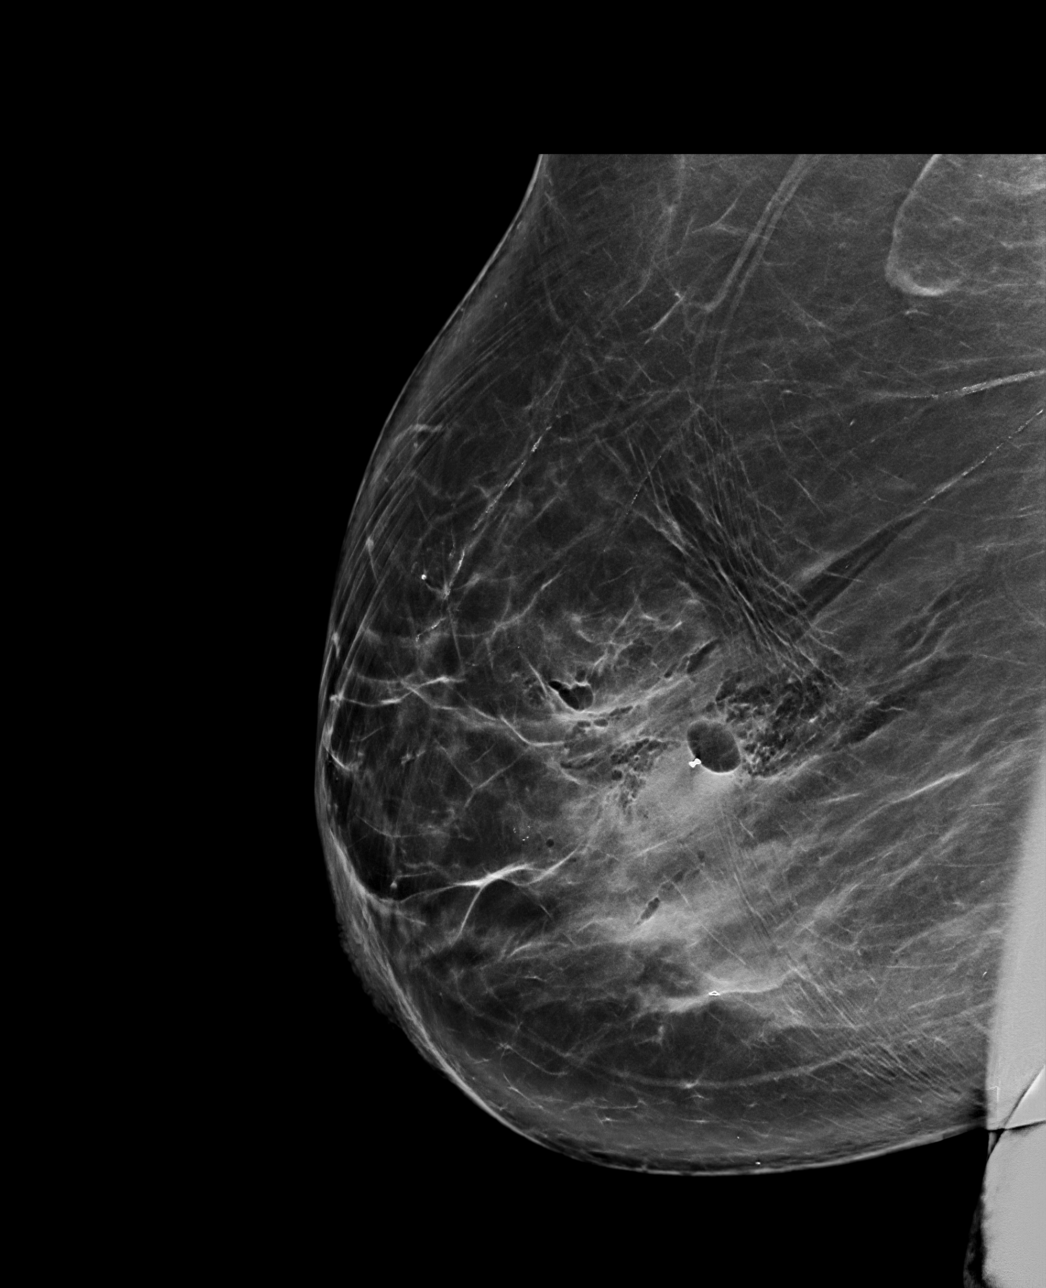

[R ML tomo · tomo slice 51/101.0]
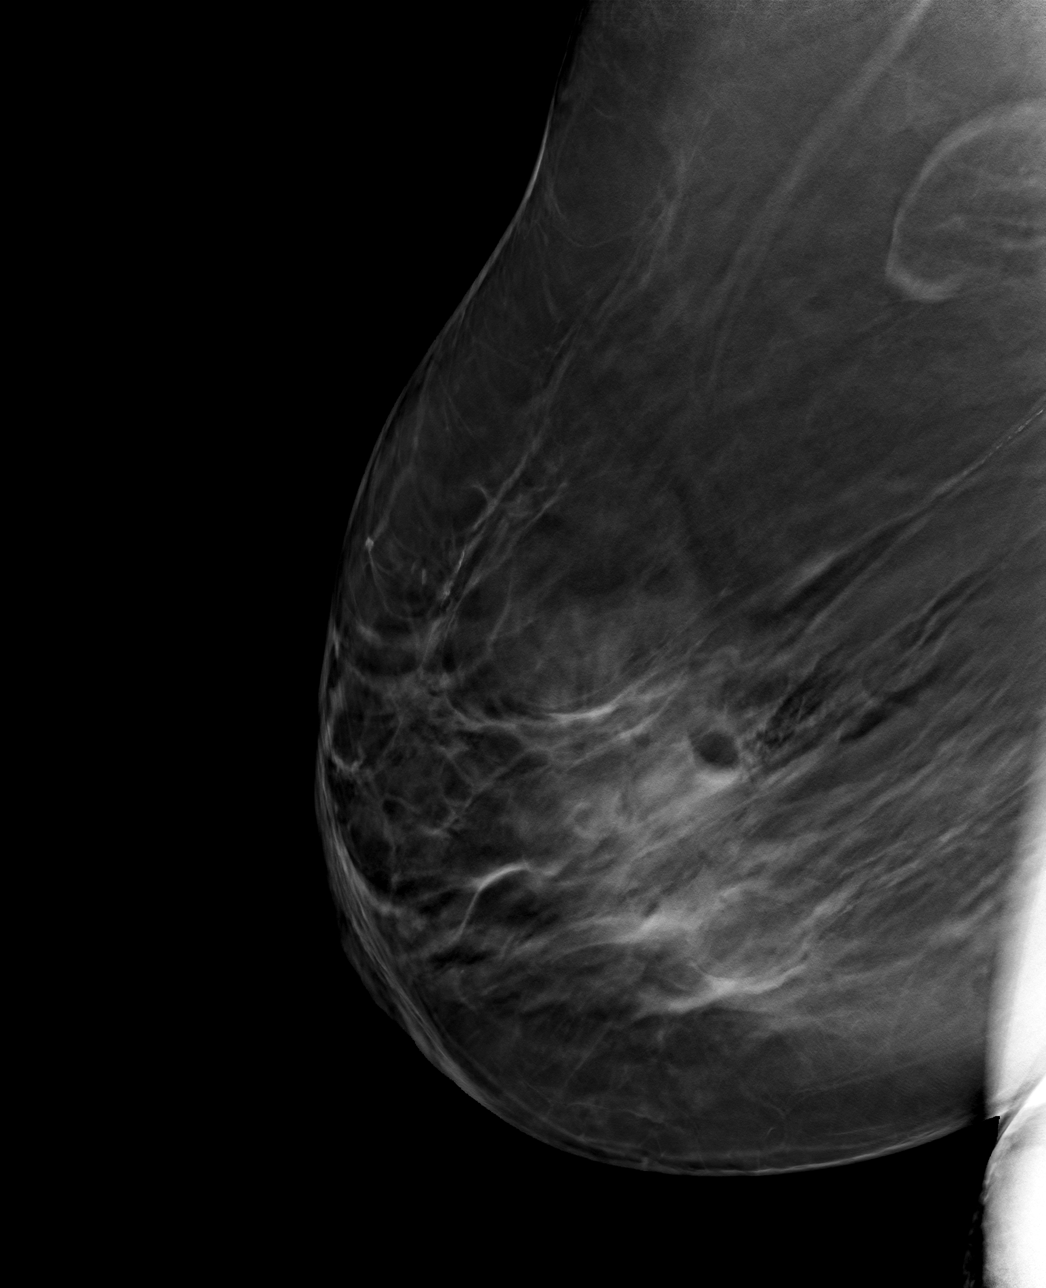

[R CC tomo · tomo slice 51/102.0]
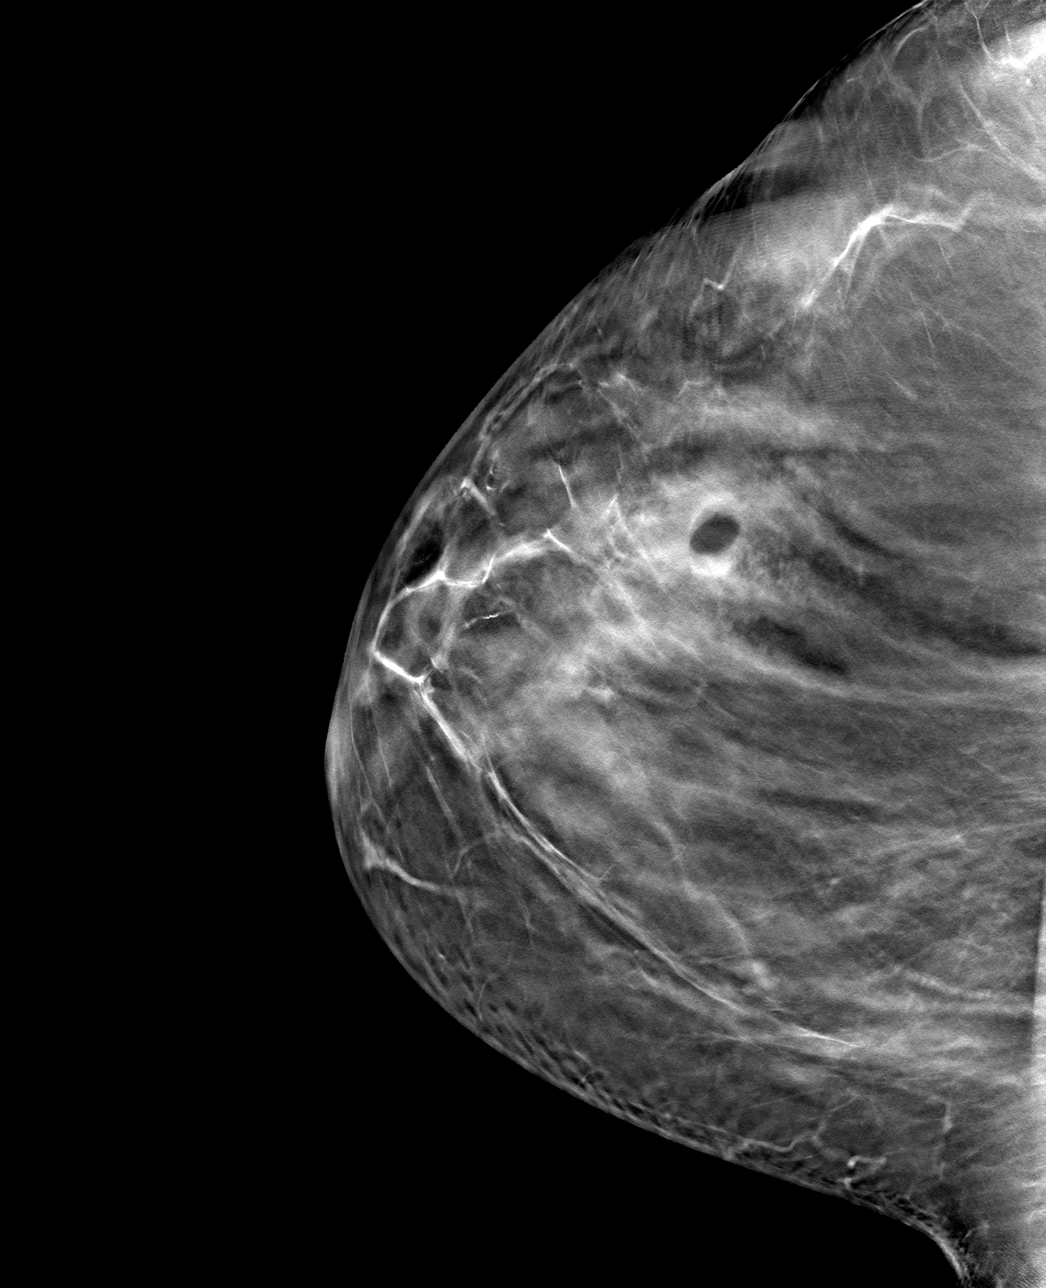

[4 of 12 positions shown; findings below may reference images not displayed]

FINDINGS: Mammographic images were obtained following MRI guided biopsy of the
right breast. The biopsy marking clip is in expected position at the
site of biopsy. Barbell shaped biopsy clip placed today.
IMPRESSION: Appropriate positioning of the barbell shaped biopsy marking clip at
the site of biopsy in the central right breast.

Final Assessment: Post Procedure Mammograms for Marker Placement

## 2019-03-09 IMAGING — MR MR BREAST BX W/ LOC DEV 1ST LEASION IMAGE BX SPEC MR GUIDE*R*
7 of 10 series · 33 of 48 positions shown · IV contrast (Gadavist)
Comparison: Previous exams.
COMPARISON: Previous exams.

Addendum:
CLINICAL DATA: MRI guided biopsy was recommended non mass
enhancement in the central right breast.

EXAM:
MRI GUIDED CORE NEEDLE BIOPSY OF THE RIGHT BREAST
TECHNIQUE: Multiplanar, multisequence MR imaging of the right breast was
performed both before and after administration of intravenous
contrast.
CONTRAST:  10mL GADAVIST GADOBUTROL 1 MMOL/ML IV SOLN

[Series 2: fiducial unilateral · sagittal · 2.0mm · 1.33mm/px · 3 of 60 slices shown]
[im 1/60]
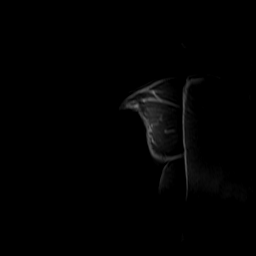
[im 30/60]
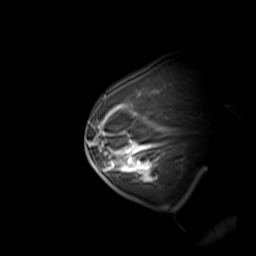
[im 60/60]
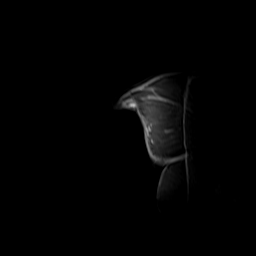

[Series 3: dynamic pre · axial · non-contrast · 1.3mm · 0.73mm/px · z∈[-65,+121]mm · 5 of 144 slices shown]
[im 1/144]
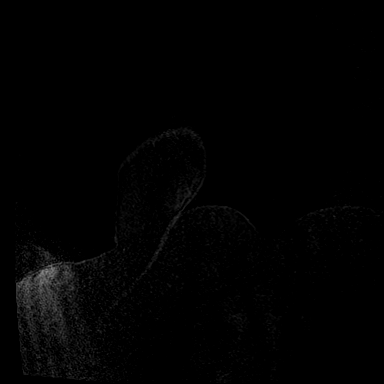
[im 36/144]
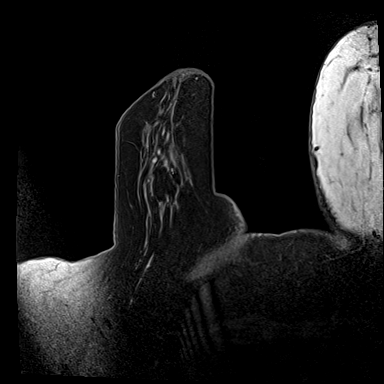
[im 72/144]
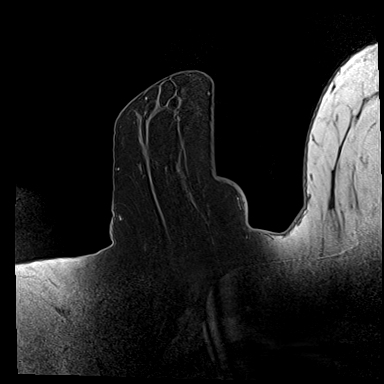
[im 108/144]
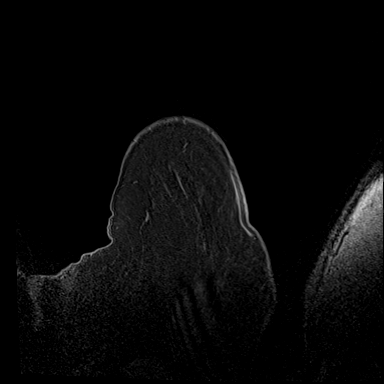
[im 144/144]
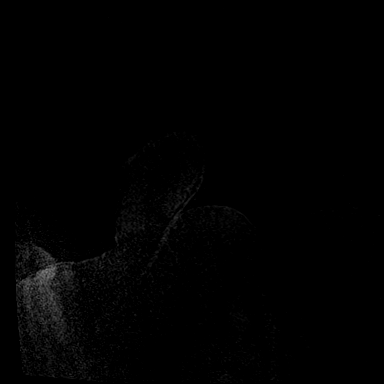

[Series 4: dynamic post 20 · axial · 1.3mm · 0.73mm/px · z∈[-65,+121]mm · 5 of 144 slices shown (1 of 2)]
[im 1/144]
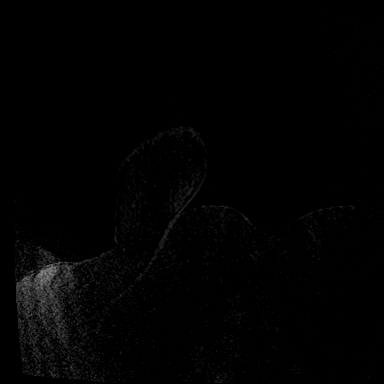
[im 36/144]
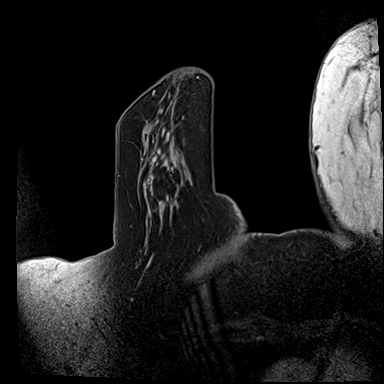
[im 72/144]
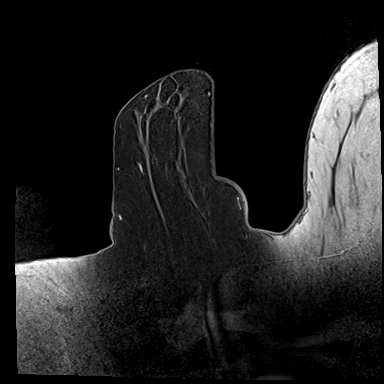
[im 108/144]
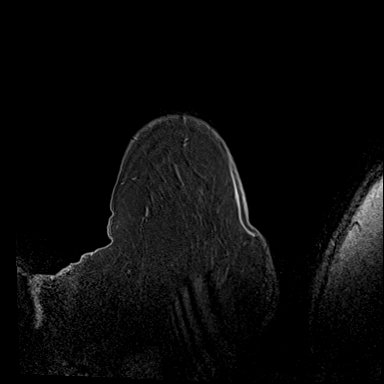
[im 144/144]
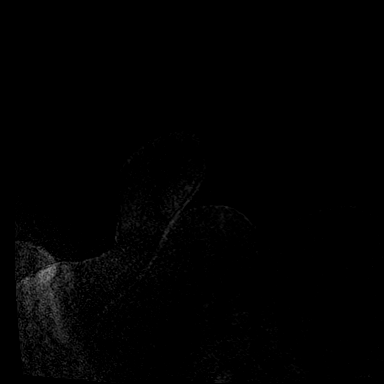

[Series 5: dynamic post 20 · axial · 1.3mm · 0.73mm/px · z∈[-65,+121]mm · 5 of 144 slices shown (2 of 2)]
[im 1/144]
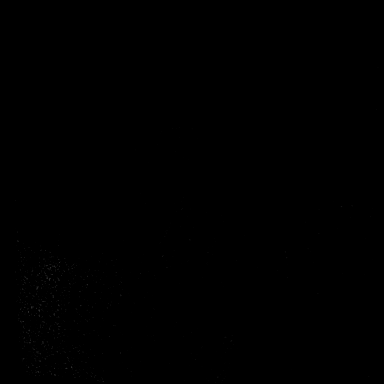
[im 36/144]
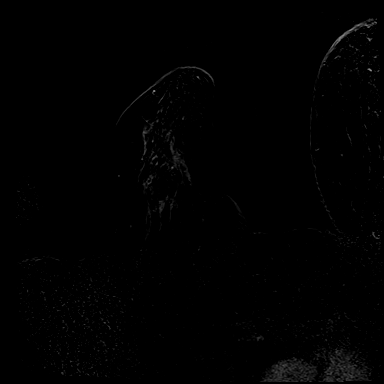
[im 72/144]
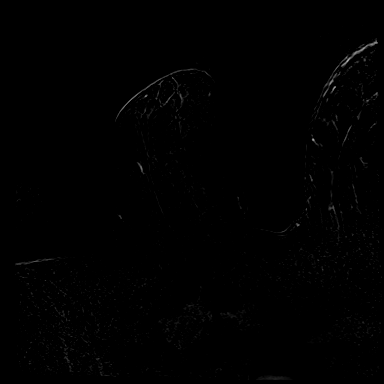
[im 108/144]
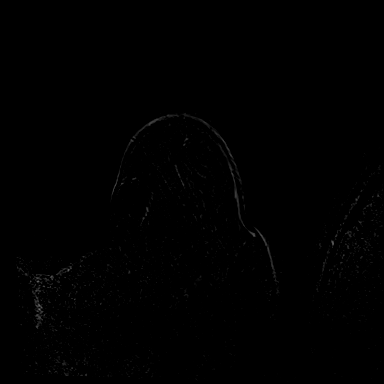
[im 144/144]
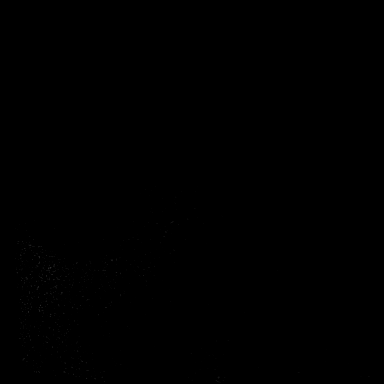

[Series 6: dynamic post 3 · axial · 1.3mm · 0.73mm/px · z∈[-65,+121]mm · 5 of 144 slices shown (1 of 2)]
[im 1/144]
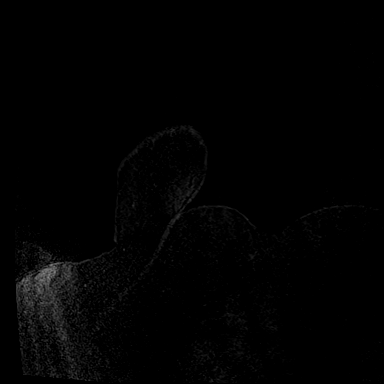
[im 36/144]
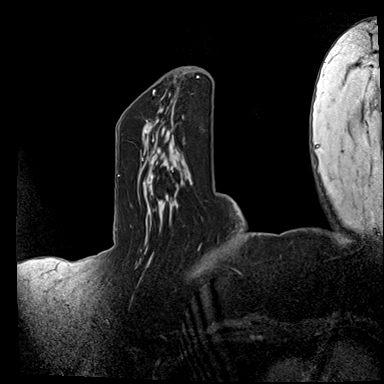
[im 72/144]
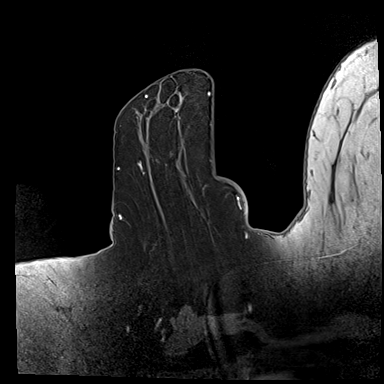
[im 108/144]
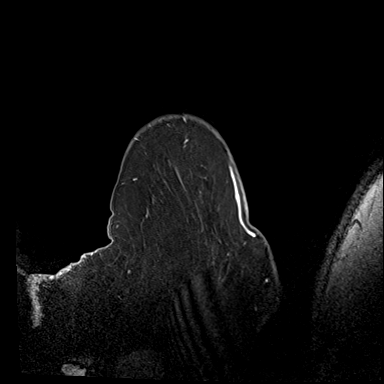
[im 144/144]
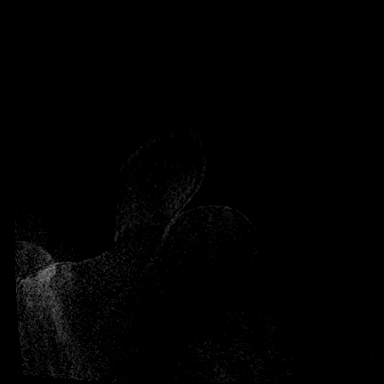

[Series 7: dynamic post 3 · axial · 1.3mm · 0.73mm/px · z∈[-65,+121]mm · 5 of 144 slices shown (2 of 2)]
[im 1/144]
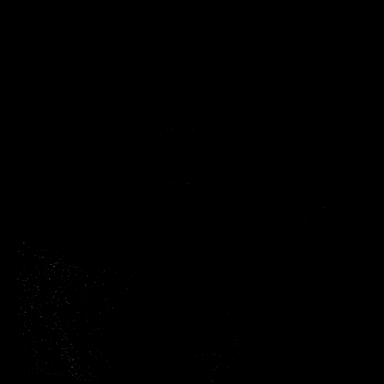
[im 36/144]
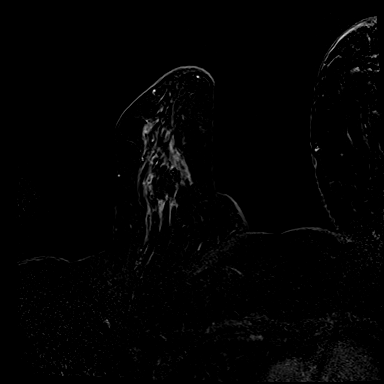
[im 72/144]
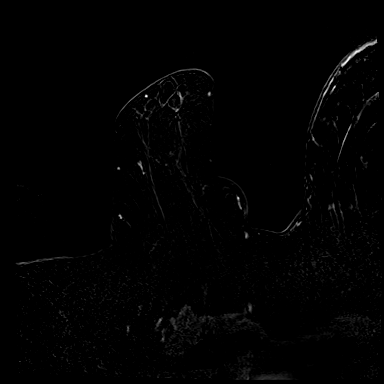
[im 108/144]
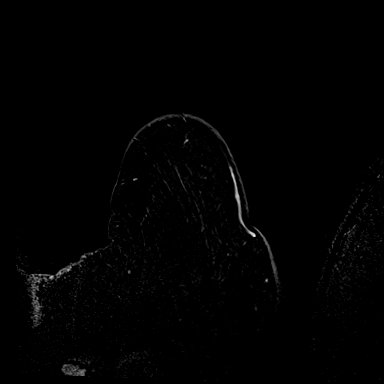
[im 144/144]
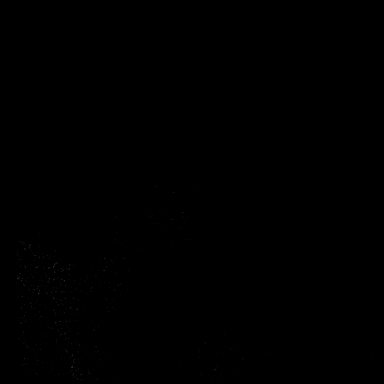

[Series 8: needle confirmation · axial · 1.3mm · 0.73mm/px · z∈[-65,+121]mm · 5 of 144 slices shown]
[im 1/144]
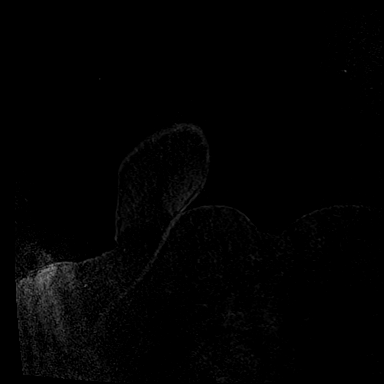
[im 36/144]
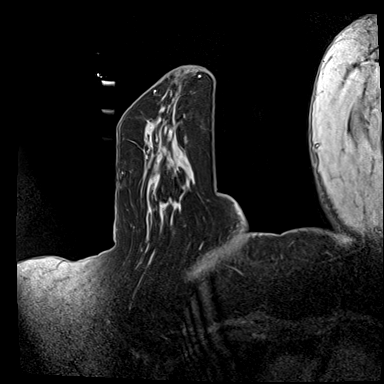
[im 72/144]
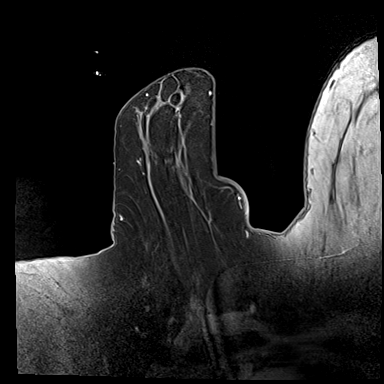
[im 108/144]
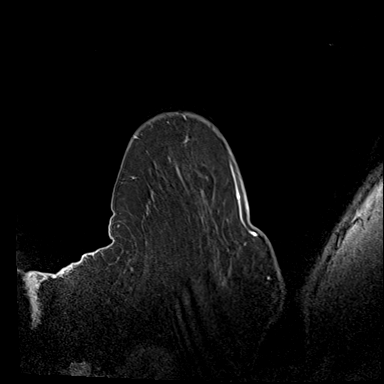
[im 144/144]
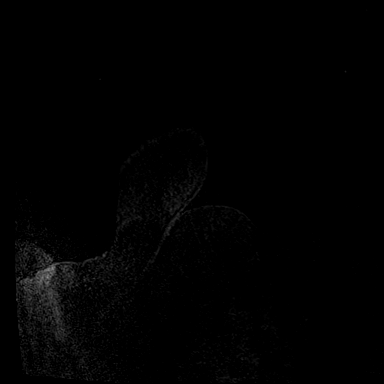

[33 of 48 positions shown; findings below may reference images not displayed]

FINDINGS: I met with the patient, and we discussed the procedure of MRI guided
biopsy, including risks, benefits, and alternatives. Specifically,
we discussed the risks of infection, bleeding, tissue injury, clip
migration, and inadequate sampling. Informed, written consent was
given. The usual time out protocol was performed immediately prior
to the procedure.

Using sterile technique, 1% Lidocaine, MRI guidance, and a 9 gauge
vacuum assisted device, biopsy was performed of non mass enhancement
in the central right breast using a lateral approach. At the
conclusion of the procedure, a barbell tissue marker clip was
deployed into the biopsy cavity. Follow-up 2-view mammogram was
performed and dictated separately.
IMPRESSION: MRI guided biopsy of right breast.  No apparent complications.

ADDENDUM:
Pathology revealed LOW-INTERMEDIATE GRADE DUCTAL CARCINOMA IN SITU
WITH MICROPAPILLARY FEATURES of the RIGHT breast, central. This was
found to be concordant by Dr. LOCKLEAR.

Pathology results were discussed with the patient and her husband by
telephone. The patient reported doing well after the biopsy with
tenderness at the site. Post biopsy instructions and care were
reviewed and questions were answered. The patient was encouraged to
call The [[facility]] for any additional
concerns.

Patient is currently being followed by Dr. LOCKLEAR of [[facility]] and Dr. LOCKLEAR of [[facility]], [[facility]]. LOCKLEAR Nurse Navigator at [[facility]] coordinated care for patient to see Dr. LOCKLEAR on
[DATE] @ [DATE] and Dr. LOCKLEAR on [DATE] @ [DATE].

Pathology results reported by LOCKLEAR RN on [DATE].

*** End of Addendum ***
FINDINGS: I met with the patient, and we discussed the procedure of MRI guided
biopsy, including risks, benefits, and alternatives. Specifically,
we discussed the risks of infection, bleeding, tissue injury, clip
migration, and inadequate sampling. Informed, written consent was
given. The usual time out protocol was performed immediately prior
to the procedure.

Using sterile technique, 1% Lidocaine, MRI guidance, and a 9 gauge
vacuum assisted device, biopsy was performed of non mass enhancement
in the central right breast using a lateral approach. At the
conclusion of the procedure, a barbell tissue marker clip was
deployed into the biopsy cavity. Follow-up 2-view mammogram was
performed and dictated separately.
IMPRESSION: MRI guided biopsy of right breast.  No apparent complications.

## 2019-03-09 MED ORDER — GADOBUTROL 1 MMOL/ML IV SOLN
10.0000 mL | Freq: Once | INTRAVENOUS | Status: AC | PRN
  Administered 2019-03-09: 10 mL via INTRAVENOUS

## 2019-03-12 ENCOUNTER — Encounter: Payer: Self-pay | Admitting: *Deleted

## 2019-03-12 NOTE — Progress Notes (Signed)
Patient's husband returned my call.  Spoke to patient to get ok to discuss results and plan of care with him.  Approval given.  Reviewed understanding of biopsy results and need for surgical and medical oncology consult.  Patient is currently being followed by Dr. Tasia Catchings and Dr. Peyton Najjar for recent cellulitis of the breast.  I have scheduled her an appointment to see Dr. Tasia Catchings on 03/14/19 @ 9:00 and Dr. Peyton Najjar on 03/16/19 @ 10:45.  Will give patient breast cancer educational literature, "My Breast Cancer Treatment Handbook" by Josephine Igo, RN at her appointment with Dr. Tasia Catchings.  She is to call with any questions or needs.

## 2019-03-12 NOTE — Progress Notes (Signed)
Notified by Stacie Acres, RN  From [facility] of patients with new diagnosis of DCIS.  Left message for her to return my call.  I would like to set up her with a surgical consult and follow up with Dr. Tasia Catchings.

## 2019-03-14 ENCOUNTER — Inpatient Hospital Stay: Payer: Medicare Other | Attending: Oncology | Admitting: Oncology

## 2019-03-14 ENCOUNTER — Other Ambulatory Visit: Payer: Self-pay

## 2019-03-14 ENCOUNTER — Encounter: Payer: Self-pay | Admitting: Oncology

## 2019-03-14 VITALS — BP 151/86 | HR 81 | Temp 97.3°F | Resp 16 | Wt 211.3 lb

## 2019-03-14 DIAGNOSIS — M25511 Pain in right shoulder: Secondary | ICD-10-CM | POA: Insufficient documentation

## 2019-03-14 DIAGNOSIS — M79641 Pain in right hand: Secondary | ICD-10-CM | POA: Insufficient documentation

## 2019-03-14 DIAGNOSIS — M25512 Pain in left shoulder: Secondary | ICD-10-CM | POA: Insufficient documentation

## 2019-03-14 DIAGNOSIS — M48061 Spinal stenosis, lumbar region without neurogenic claudication: Secondary | ICD-10-CM | POA: Diagnosis not present

## 2019-03-14 DIAGNOSIS — Z79899 Other long term (current) drug therapy: Secondary | ICD-10-CM | POA: Insufficient documentation

## 2019-03-14 DIAGNOSIS — Z171 Estrogen receptor negative status [ER-]: Secondary | ICD-10-CM | POA: Diagnosis not present

## 2019-03-14 DIAGNOSIS — I10 Essential (primary) hypertension: Secondary | ICD-10-CM | POA: Insufficient documentation

## 2019-03-14 DIAGNOSIS — M129 Arthropathy, unspecified: Secondary | ICD-10-CM | POA: Diagnosis not present

## 2019-03-14 DIAGNOSIS — D0511 Intraductal carcinoma in situ of right breast: Secondary | ICD-10-CM | POA: Diagnosis not present

## 2019-03-14 DIAGNOSIS — M79642 Pain in left hand: Secondary | ICD-10-CM | POA: Diagnosis not present

## 2019-03-14 DIAGNOSIS — M810 Age-related osteoporosis without current pathological fracture: Secondary | ICD-10-CM | POA: Diagnosis not present

## 2019-03-14 NOTE — Progress Notes (Signed)
Hematology/Oncology follow up note [facility] Telephone:([phone]) [phone] [phone]) [phone]   Patient Care Team: Maryland Pink, MD as PCP - General (Family Medicine)  REFERRING PROVIDER: Maryland Pink, MD  CHIEF COMPLAINTS/REASON FOR VISIT:  Follow-up for DCIS  HISTORY OF PRESENTING ILLNESS:   Mary Mckay is a  85 y.o.  female with PMH listed below was seen in consultation at the request of  Maryland Pink, MD  for evaluation of right breast swelling.   Patient reports that she noticed right breast pain in July 2020 and had drainage around right nipple. She took 2 rounds of antibiotics to treat breast cellulitis and pain has resolved. Today she has no breast pain or discomfort. She feels that her right breast maybe a bit swelling.  Most of her concerns today was bilateral shoulder pain, right worse than left, pain is worsen with joint movement, especially when she raises her hand to comb her hair.  Denies any personal history of cancer, denies family history of cancer.   08/09/2018 bilateral diagnostic mammogram and targeted US showed no suspicious mass or malignant type microcalcification in left breast   INTERVAL HISTORY Mary Mckay is a 85 y.o. female who has above history reviewed by me today presents for follow up visit for DCIS.   During the interval, patient has had evaluation by surgery. She has also had MRI bilateral breast and was found to have indeterminate no mass enhancing area of the central right breast with associated retraction of the nipple and a possible mild periareolar skin thickening. Patient underwent MRI guided biopsy.  Patient presents to discuss pathology accompanied by daughter Katharine Look.  Patient has no new complaints.  Review of Systems  Constitutional: Negative for appetite change, chills, fatigue and fever.  HENT:   Negative for hearing loss and voice change.   Eyes: Negative for eye problems.  Respiratory: Negative for  chest tightness and cough.   Cardiovascular: Negative for chest pain.  Gastrointestinal: Negative for abdominal distention, abdominal pain and blood in stool.  Endocrine: Negative for hot flashes.  Genitourinary: Negative for difficulty urinating and frequency.   Musculoskeletal: Positive for arthralgias.  Skin: Negative for itching and rash.  Neurological: Negative for extremity weakness.  Hematological: Negative for adenopathy.  Psychiatric/Behavioral: Negative for confusion.    MEDICAL HISTORY:  Past Medical History:  Diagnosis Date  . Allergic state   . Arthritis   . Cataract cortical, senile   . Diverticulitis   . Glaucoma   . Hypertension   . Osteoporosis     SURGICAL HISTORY: Past Surgical History:  Procedure Laterality Date  . ABDOMINAL HYSTERECTOMY    . BLADDER SURGERY    . BREAST BIOPSY Right early 2000s   benign  . COLONOSCOPY WITH PROPOFOL N/A 03/25/2017   Procedure: COLONOSCOPY WITH PROPOFOL;  Surgeon: [name], MD;  Location: [facility] ENDOSCOPY;  Service: Endoscopy;  Laterality: N/A;  . WRIST SURGERY     Right     SOCIAL HISTORY: Social History   Socioeconomic History  . Marital status: Married    Spouse name: Not on file  . Number of children: Not on file  . Years of education: Not on file  . Highest education level: Not on file  Occupational History  . Not on file  Tobacco Use  . Smoking status: Never Smoker  . Smokeless tobacco: Never Used  Substance and Sexual Activity  . Alcohol use: Not Currently  . Drug use: Never  . Sexual activity: Not on  file    Comment: Married   Other Topics Concern  . Not on file  Social History Narrative  . Not on file   Social Determinants of Health   Financial Resource Strain:   . Difficulty of Paying Living Expenses: Not on file  Food Insecurity:   . Worried About Charity fundraiser in the Last Year: Not on file  . Ran Out of Food in the Last Year: Not on file  Transportation Needs:   . Lack of  Transportation (Medical): Not on file  . Lack of Transportation (Non-Medical): Not on file  Physical Activity:   . Days of Exercise per Week: Not on file  . Minutes of Exercise per Session: Not on file  Stress:   . Feeling of Stress : Not on file  Social Connections:   . Frequency of Communication with Friends and Family: Not on file  . Frequency of Social Gatherings with Friends and Family: Not on file  . Attends Religious Services: Not on file  . Active Member of Clubs or Organizations: Not on file  . Attends Archivist Meetings: Not on file  . Marital Status: Not on file  Intimate Partner Violence:   . Fear of Current or Ex-Partner: Not on file  . Emotionally Abused: Not on file  . Physically Abused: Not on file  . Sexually Abused: Not on file    FAMILY HISTORY: Family History  Problem Relation Age of Onset  . Osteoporosis Mother   . Stroke Mother   . Heart attack Father   . Alcohol abuse Sister   . Kidney disease Sister   . Allergic rhinitis Sister   . Diabetes Sister   . Heart attack Sister   . Stroke Sister   . Hypertension Sister   . Breast cancer Sister 6  . Diabetes Brother   . Heart attack Brother   . Hypertension Brother     ALLERGIES:  is allergic to fentanyl and meloxicam.  MEDICATIONS:  Current Outpatient Medications  Medication Sig Dispense Refill  . atenolol (TENORMIN) 100 MG tablet Take 100 mg daily by mouth.    . candesartan (ATACAND) 32 MG tablet Take 32 mg daily by mouth.    . fluticasone (VERAMYST) 27.5 MCG/SPRAY nasal spray Place 2 sprays daily into the nose.    . gabapentin (NEURONTIN) 100 MG capsule 1 po qHS    . hydrochlorothiazide (HYDRODIURIL) 25 MG tablet Take 25 mg daily by mouth.    . latanoprost (XALATAN) 0.005 % ophthalmic solution Place 1 drop into both eyes at bedtime.    Marland Kitchen linagliptin (TRADJENTA) 5 MG TABS tablet Take 5 mg daily by mouth.    Marland Kitchen NIFEdipine (PROCARDIA-XL/ADALAT CC) 30 MG 24 hr tablet Take 30 mg daily by  mouth.    . OMEGA 3-6-9 FATTY ACIDS PO Take 1,200 mg by mouth daily.    . potassium chloride (K-DUR,KLOR-CON) 10 MEQ tablet Take 10 mEq by mouth 2 (two) times daily.    . predniSONE (DELTASONE) 5 MG tablet Take 5 mg by mouth. Tapering down.  Now taking 2.5 tabs QD    . psyllium (METAMUCIL) 58.6 % packet Take 1 packet by mouth daily.    . traMADol (ULTRAM) 50 MG tablet Take 50 mg every 8 (eight) hours as needed by mouth.     No current facility-administered medications for this visit.     PHYSICAL EXAMINATION: ECOG PERFORMANCE STATUS: 1 - Symptomatic but completely ambulatory Vitals:   03/14/19 0911  BP: Marland Kitchen)  151/86  Pulse: 81  Resp: 16  Temp: (!) 97.3 F (36.3 C)   Filed Weights   03/14/19 0911  Weight: 211 lb 4.8 oz (95.8 kg)    Physical Exam Constitutional:      General: She is not in acute distress.    Appearance: She is obese.  HENT:     Head: Normocephalic and atraumatic.  Eyes:     General: No scleral icterus.    Pupils: Pupils are equal, round, and reactive to light.  Cardiovascular:     Rate and Rhythm: Normal rate and regular rhythm.     Heart sounds: Normal heart sounds.  Pulmonary:     Effort: Pulmonary effort is normal. No respiratory distress.     Breath sounds: No wheezing.  Abdominal:     General: Bowel sounds are normal. There is no distension.     Palpations: Abdomen is soft. There is no mass.     Tenderness: There is no abdominal tenderness.  Musculoskeletal:        General: No deformity. Normal range of motion.     Cervical back: Normal range of motion and neck supple.  Skin:    General: Skin is warm and dry.     Findings: No erythema or rash.  Neurological:     Mental Status: She is alert and oriented to person, place, and time. Mental status is at baseline.     Cranial Nerves: No cranial nerve deficit.     Coordination: Coordination normal.  Psychiatric:        Mood and Affect: Mood normal.        Behavior: Behavior normal.        Thought  Content: Thought content normal.        LABORATORY DATA:  I have reviewed the data as listed Lab Results  Component Value Date   WBC 8.6 10/06/2018   HGB 12.4 10/06/2018   HCT 39.5 10/06/2018   MCV 90.8 10/06/2018   PLT 265 10/06/2018   Recent Labs    10/06/18 1918 02/19/19 0925  NA 136  --   K 3.4*  --   CL 100  --   CO2 25  --   GLUCOSE 124*  --   BUN 15  --   CREATININE 0.61 0.60  CALCIUM 9.7  --   GFRNONAA >60  --   GFRAA >60  --   PROT 7.7  --   ALBUMIN 3.7  --   AST 24  --   ALT 29  --   ALKPHOS 46  --   BILITOT 0.7  --    Iron/TIBC/Ferritin/ %Sat No results found for: IRON, TIBC, FERRITIN, IRONPCTSAT    RADIOGRAPHIC STUDIES: I have personally reviewed the radiological images as listed and agreed with the findings in the report. MR LUMBAR SPINE WO CONTRAST  Result Date: 01/15/2019 CLINICAL DATA:  Low back and bilateral leg pain for 2 months. EXAM: MRI LUMBAR SPINE WITHOUT CONTRAST TECHNIQUE: Multiplanar, multisequence MR imaging of the lumbar spine was performed. No intravenous contrast was administered. COMPARISON:  CT abdomen and pelvis 10/07/2018. FINDINGS: Segmentation:  Standard. Alignment: There is 0.2 cm retrolisthesis L2 on L3, 0.3 cm anterolisthesis L4 on L5 and 0.4 cm anterolisthesis L5 on S1. Vertebrae:  No fracture, evidence of discitis, or bone lesion. Conus medullaris and cauda equina: Conus extends to the T12-L1 level. Conus and cauda equina appear normal. Paraspinal and other soft tissues: Negative. Disc levels: T10-11 and T11-12 are imaged in the  sagittal plane only. There is a shallow disc bulge at T10-11. A central disc extrusion with cephalad extension is seen at T11-12. The central canal and foramina appear open at both levels. T12-L1: Shallow disc bulge and mild-to-moderate facet degenerative change. No stenosis L1-2: Mild-to-moderate facet arthropathy. There is a broad-based right paracentral protrusion and mild central canal stenosis. The  foramina remain open. L2-3: Broad-based disc bulge causes moderate central canal stenosis and narrowing in the subarticular recesses. There is also mild bilateral foraminal narrowing. L3-4: Moderate facet arthropathy, ligamentum flavum thickening and a diffuse broad-based disc bulge. There is moderate to moderately severe central canal stenosis and right much worse than left subarticular recess narrowing. The left foramen is open. Mild right foraminal narrowing noted. L4-5: Advanced facet degenerative disease, bulky ligamentum flavum thickening and a large broad-based central protrusion cause severe central canal and bilateral subarticular recess stenosis. Mild bilateral foraminal narrowing is present. L5-S1: Advanced bilateral facet degenerative change. The right facets are ankylosed. The disc is uncovered with a shallow bulge but the central canal and right foramen are open. Mild to moderate left foraminal narrowing noted. IMPRESSION: 1. 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 2. Moderate to moderately severe central canal stenosis and right worse than left subarticular recess narrowing at L3-4 where there is also mild right foraminal narrowing. 3. Moderate central canal stenosis and narrowing in the subarticular recesses at L2-3 where there is a broad-based disc bulge. 4. Advanced facet degenerative change at L5-S1 where there is mild to moderate left foraminal narrowing. Electronically Signed   By: Inge Rise M.D.   On: 01/15/2019 15:13   MR BREAST BILATERAL W WO CONTRAST INC CAD  Result Date: 02/19/2019 CLINICAL DATA:  85 year old female with history of recurrent right breast cellulitis and nipple discharge over the past 2 years. History notes no pain or discharge at the time of examination. LABS:  None performed on site. EXAM: BILATERAL BREAST MRI WITH AND WITHOUT CONTRAST TECHNIQUE: Multiplanar, multisequence MR images of both breasts  were obtained prior to and following the intravenous administration of 10 ml of Gadavist. Three-dimensional MR images were rendered by post-processing of the original MR data on an independent workstation. The three-dimensional MR images were interpreted, and findings are reported in the following complete MRI report for this study. Three dimensional images were evaluated at the independent DynaCad workstation COMPARISON:  Previous exam(s). FINDINGS: Breast composition: b. Scattered fibroglandular tissue. Background parenchymal enhancement: Moderate. Right breast: The right breast appears slightly retracted compared to the left. There is non-mass enhancement involving the entire central aspect extending from the nipple posteriorly (series 12, image 38/112). There is mild associated nipple retraction and possible thickening of the periareolar skin. Overall enhancement measures 8.3 x 5.1 cm in axial dimensions. No dominant mass identified. Left breast: No suspicious mass or abnormal enhancement. Lymph nodes: No abnormal appearing lymph nodes. Ancillary findings:  None. IMPRESSION: 1. Indeterminate non-mass enhancement of the central right breast with associated retraction of the nipple and possible mild periareolar skin thickening. While these findings could potentially be post inflammatory in nature, given that the patient has no current clinical symptoms, recommendation is for definitive tissue diagnosis. 2. No MRI evidence of malignancy on the left. 3. No suspicious lymphadenopathy. RECOMMENDATION: Single area MRI guided biopsy of right breast non-mass enhancement. BI-RADS CATEGORY  4: Suspicious. Electronically Signed   By: Kristopher Oppenheim M.D.   On: 02/19/2019 11:38   MM DIAG BREAST TOMO UNI  RIGHT  Result Date: 02/05/2019 CLINICAL DATA:  Six-month interval follow-up after treatment for RIGHT breast mastitis.Patient states that her symptoms have resolved. EXAM: DIGITAL DIAGNOSTIC UNILATERAL RIGHT MAMMOGRAM WITH  CAD AND TOMO COMPARISON:  Previous exam(s). ACR Breast Density Category b: There are scattered areas of fibroglandular density. FINDINGS: Tomosynthesis and synthesized full field CC and MLO views of the RIGHT breast were obtained. No findings suspicious for malignancy in the RIGHT breast. The focal asymmetry involving OUTER breast has a similar appearance to multiple prior mammograms dating back to at least 2015. Mammographic images were processed with CAD. IMPRESSION: No mammographic evidence of malignancy involving the RIGHT breast. RECOMMENDATION: Annual BILATERAL screening mammography which is due in 6 months. I have discussed the findings and recommendations with the patient. If applicable, a reminder letter will be sent to the patient regarding the next appointment. BI-RADS CATEGORY  1: Negative. Electronically Signed   By: Evangeline Dakin M.D.   On: 02/05/2019 10:37   MM CLIP PLACEMENT RIGHT  Result Date: 03/09/2019 CLINICAL DATA:  MRI guided biopsy was performed non mass enhancement in the central right breast. EXAM: DIAGNOSTIC RIGHT MAMMOGRAM POST MRI BIOPSY COMPARISON:  Previous exam(s). FINDINGS: Mammographic images were obtained following MRI guided biopsy of the right breast. The biopsy marking clip is in expected position at the site of biopsy. Barbell shaped biopsy clip placed today. IMPRESSION: Appropriate positioning of the barbell shaped biopsy marking clip at the site of biopsy in the central right breast. Final Assessment: Post Procedure Mammograms for Marker Placement Electronically Signed   By: Curlene Dolphin M.D.   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 This was found to be concordant by Dr. Curlene Dolphin. Pathology results were discussed with the  patient and her husband by telephone. The patient reported doing well after the biopsy with tenderness at the site. Post biopsy instructions and care were reviewed and questions were answered. The patient was encouraged to call [facility] for any additional concerns. Patient is currently being followed by Dr. Earlie Server of [facility] and Dr. Herbert Pun of [facility] [place]. Tanya Nones Nurse Navigator at [facility] coordinated care for patient to see Dr. Tasia Catchings on 03/14/19 @ 9:00 and Dr. Windell Moment on 03/16/19 @ 10:45. Pathology results reported by Stacie Acres RN on 03/13/2019. Electronically Signed   By: Curlene Dolphin M.D.   On: 03/13/2019 13:41   Result Date: 03/13/2019 CLINICAL DATA:  MRI guided biopsy was recommended non mass enhancement in the central right breast. EXAM: MRI GUIDED CORE NEEDLE BIOPSY OF THE RIGHT BREAST TECHNIQUE: Multiplanar, multisequence MR imaging of the right breast was performed both before and after administration of intravenous contrast. CONTRAST:  38m GADAVIST GADOBUTROL 1 MMOL/ML IV SOLN COMPARISON:  Previous exams. FINDINGS: I met with the patient, and we discussed the procedure of MRI guided biopsy, including risks, benefits, and alternatives. Specifically, we discussed the risks of infection, bleeding, tissue injury, clip migration, and inadequate sampling. Informed, written consent was given. The usual time out protocol was performed immediately prior to the procedure. Using sterile technique, 1% Lidocaine, MRI guidance, and a 9 gauge vacuum assisted device, biopsy was performed of non mass enhancement in the central right breast using a lateral  approach. At the conclusion of the procedure, a barbell tissue marker clip was deployed into the biopsy cavity. Follow-up 2-view mammogram was performed and dictated separately. IMPRESSION: MRI guided biopsy of right breast.  No apparent complications.  Electronically Signed: By: Curlene Dolphin M.D. On: 03/09/2019 10:27      ASSESSMENT & PLAN:  1. Ductal carcinoma in situ (DCIS) of right breast    MRI images were independent reviewed by me and discussed.  No mass enhancement of central right breast with nipple retraction and periareolar skin thickening, 8 x 5cm.  I also discussed with patient and daughter about pathology reports from MRI biopsy.  DCIS, low to intermediate grade and has micropapillary features, ER/PR negative.  The DCIS diagnosis and care plan were discussed with patient in detail.  I  discussed that DCIS is a non invasive breast cancer, given the large size of non-mass enhancement, patient probably would need mastectomy.  Patient also has skin tissue thickening and nipple changes, DCIS is negative for hormone receptor, it is possible that she has Paget disease, for which mastectomy is the treatment.  I also discussed about possibilities of finding invasive breast histology on the final pathology. If patient gets mastectomy, I recommend sentinel lymph node biopsy. Communicated with surgery Dr. Peyton Najjar.  Patient has appointment with him this week for discussion of surgery.   All questions were answered. The patient knows to call the clinic with any problems questions or concerns.  cc Maryland Pink, MD  Dr.Cintron.   Return of visit 10 days after surgery  Earlie Server, MD, PhD Hematology Oncology [facility] Pager- [phone] 03/14/2019

## 2019-03-14 NOTE — Progress Notes (Signed)
Patient does not offer any problems today.  

## 2019-03-19 ENCOUNTER — Telehealth: Payer: Self-pay

## 2019-03-19 NOTE — Progress Notes (Signed)
Patient is coming in for MD discussion about mastectomy. Pain in her legs but swelling in her ankles is new started a couple days ago. She is in PT for her legs once a week.   Patient would like a second opinion for her surgery. Its not that she does not trust Dr. Windell Moment but she would like a second opinion

## 2019-03-19 NOTE — Telephone Encounter (Signed)
[place] message received form Al Pimple, RN:    Vita Barley got a call from patients daughter, She would like appointment with Dr. Tasia Catchings . She wants to go over images from MRI with Dr. Tasia Catchings , because they are recommending Mastectomy. Please advise, and Vita Barley will call daughter back.   Patient has been scheduled to see Dr. Tasia Catchings tomorrow 03/20/19 @ 9:15.  Vita Barley will inform pt and her daughter of appt.

## 2019-03-20 ENCOUNTER — Encounter: Payer: Self-pay | Admitting: Oncology

## 2019-03-20 ENCOUNTER — Inpatient Hospital Stay (HOSPITAL_BASED_OUTPATIENT_CLINIC_OR_DEPARTMENT_OTHER): Payer: Medicare Other | Admitting: Oncology

## 2019-03-20 VITALS — BP 189/85 | HR 80 | Temp 97.8°F | Resp 16 | Wt 209.6 lb

## 2019-03-20 DIAGNOSIS — D0511 Intraductal carcinoma in situ of right breast: Secondary | ICD-10-CM

## 2019-03-20 NOTE — Progress Notes (Signed)
Hematology/Oncology follow up note [facility] Telephone:([phone] [phone]) [phone]   Patient Care Team: Maryland Pink, MD as PCP - General (Family Medicine)  REFERRING PROVIDER: Maryland Pink, MD  CHIEF COMPLAINTS/REASON FOR VISIT:  Follow-up for DCIS  HISTORY OF PRESENTING ILLNESS:   Mary Mckay is a  85 y.o.  female with PMH listed below was seen in consultation at the request of  Maryland Pink, MD  for evaluation of right breast swelling.   Patient reports that she noticed right breast pain in July 2020 and had drainage around right nipple. She took 2 rounds of antibiotics to treat breast cellulitis and pain has resolved. Today she has no breast pain or discomfort. She feels that her right breast maybe a bit swelling.  Most of her concerns today was bilateral shoulder pain, right worse than left, pain is worsen with joint movement, especially when she raises her hand to comb her hair.  Denies any personal history of cancer, denies family history of cancer.   08/09/2018 bilateral diagnostic mammogram and targeted US showed no suspicious mass or malignant type microcalcification in left breast 02/19/2019 MRI bilateral breast and was found to have indeterminate no mass enhancing area of the central right breast with associated retraction of the nipple and a possible mild periareolar skin thickening. 03/09/2019 Patient underwent MRI guided biopsy.  INTERVAL HISTORY Mary Mckay is a 85 y.o. female who has above history reviewed by me today presents for follow up visit for DCIS.   During the interval, patient had a discussion with surgery Dr. Windell Moment and was recommended for mastectomy + sentinel lymph node biopsy.  Patient was accompanied by daughter today for further discussion.  Daughter has called and preferred another discussion regarding a second opinion. Patient says' I can not think about the idea of mastectomy".  She says" I do not know  what to do"   Review of Systems  Constitutional: Negative for appetite change, chills, fatigue and fever.  HENT:   Negative for hearing loss and voice change.   Eyes: Negative for eye problems.  Respiratory: Negative for chest tightness and cough.   Cardiovascular: Negative for chest pain.  Gastrointestinal: Negative for abdominal distention, abdominal pain and blood in stool.  Endocrine: Negative for hot flashes.  Genitourinary: Negative for difficulty urinating and frequency.   Musculoskeletal: Positive for arthralgias.  Skin: Negative for itching and rash.  Neurological: Negative for extremity weakness.  Hematological: Negative for adenopathy.  Psychiatric/Behavioral: Negative for confusion. The patient is nervous/anxious.     MEDICAL HISTORY:  Past Medical History:  Diagnosis Date  . Allergic state   . Arthritis   . Cataract cortical, senile   . Diverticulitis   . Glaucoma   . Hypertension   . Osteoporosis     SURGICAL HISTORY: Past Surgical History:  Procedure Laterality Date  . ABDOMINAL HYSTERECTOMY    . BLADDER SURGERY    . BREAST BIOPSY Right early 2000s   benign  . COLONOSCOPY WITH PROPOFOL N/A 03/25/2017   Procedure: COLONOSCOPY WITH PROPOFOL;  Surgeon: [name], MD;  Location: [facility] END[facility]COPY;  Service: Endoscopy;  Laterality: N/A;  . WRIST SURGERY     Right     SOCIAL HISTORY: Social History   Socioeconomic History  . Marital status: Married    Spouse name: Not on file  . Number of children: Not on file  . Years of education: Not on file  . Highest education level: Not on file  Occupational  History  . Not on file  Tobacco Use  . Smoking status: Never Smoker  . Smokeless tobacco: Never Used  Substance and Sexual Activity  . Alcohol use: Not Currently  . Drug use: Never  . Sexual activity: Not on file    Comment: Married   Other Topics Concern  . Not on file  Social History Narrative  . Not on file   Social Determinants of Health     Financial Resource Strain:   . Difficulty of Paying Living Expenses:   Food Insecurity:   . Worried About Charity fundraiser in the Last Year:   . Arboriculturist in the Last Year:   Transportation Needs:   . Film/video editor (Medical):   Marland Kitchen Lack of Transportation (Non-Medical):   Physical Activity:   . Days of Exercise per Week:   . Minutes of Exercise per Session:   Stress:   . Feeling of Stress :   Social Connections:   . Frequency of Communication with Friends and Family:   . Frequency of Social Gatherings with Friends and Family:   . Attends Religious Services:   . Active Member of Clubs or Organizations:   . Attends Archivist Meetings:   Marland Kitchen Marital Status:   Intimate Partner Violence:   . Fear of Current or Ex-Partner:   . Emotionally Abused:   Marland Kitchen Physically Abused:   . Sexually Abused:     FAMILY HISTORY: Family History  Problem Relation Age of Onset  . Osteoporosis Mother   . Stroke Mother   . Heart attack Father   . Alcohol abuse Sister   . Kidney disease Sister   . Allergic rhinitis Sister   . Diabetes Sister   . Heart attack Sister   . Stroke Sister   . Hypertension Sister   . Breast cancer Sister 4  . Diabetes Brother   . Heart attack Brother   . Hypertension Brother     ALLERGIES:  is allergic to fentanyl and meloxicam.  MEDICATIONS:  Current Outpatient Medications  Medication Sig Dispense Refill  . atenolol (TENORMIN) 100 MG tablet Take 100 mg daily by mouth.    . candesartan (ATACAND) 32 MG tablet Take 32 mg daily by mouth.    . fluticasone (VERAMYST) 27.5 MCG/SPRAY nasal spray Place 2 sprays daily into the nose.    . gabapentin (NEURONTIN) 100 MG capsule 1 po qHS    . hydrochlorothiazide (HYDRODIURIL) 25 MG tablet Take 25 mg daily by mouth.    . latanoprost (XALATAN) 0.005 % ophthalmic solution Place 1 drop into both eyes at bedtime.    Marland Kitchen linagliptin (TRADJENTA) 5 MG TABS tablet Take 5 mg daily by mouth.    Marland Kitchen NIFEdipine  (PROCARDIA-XL/ADALAT CC) 30 MG 24 hr tablet Take 30 mg daily by mouth.    . OMEGA 3-6-9 FATTY ACIDS PO Take 1,200 mg by mouth daily.    . potassium chloride (K-DUR,KLOR-CON) 10 MEQ tablet Take 10 mEq by mouth 2 (two) times daily.    . predniSONE (DELTASONE) 5 MG tablet Take 5 mg by mouth. Tapering down.  Now taking 2.5 tabs QD    . psyllium (METAMUCIL) 58.6 % packet Take 1 packet by mouth daily.    . traMADol (ULTRAM) 50 MG tablet Take 50 mg every 8 (eight) hours as needed by mouth.     No current facility-administered medications for this visit.     PHYSICAL EXAMINATION: ECOG PERFORMANCE STATUS: 1 - Symptomatic but completely  ambulatory Vitals:   03/20/19 0908  BP: (!) 189/85  Pulse: 80  Resp: 16  Temp: 97.8 F (36.6 C)  SpO2: 98%   Filed Weights   03/20/19 0908  Weight: 209 lb 9.6 oz (95.1 kg)    Physical Exam Constitutional:      General: She is not in acute distress. HENT:     Head: Normocephalic and atraumatic.  Eyes:     General: No scleral icterus. Cardiovascular:     Rate and Rhythm: Normal rate and regular rhythm.     Heart sounds: Normal heart sounds.  Pulmonary:     Effort: Pulmonary effort is normal. No respiratory distress.     Breath sounds: No wheezing.  Abdominal:     General: Bowel sounds are normal. There is no distension.     Palpations: Abdomen is soft.  Musculoskeletal:        General: No deformity. Normal range of motion.     Cervical back: Normal range of motion and neck supple.  Skin:    General: Skin is warm and dry.     Findings: No erythema or rash.  Neurological:     Mental Status: She is alert and oriented to person, place, and time. Mental status is at baseline.     Cranial Nerves: No cranial nerve deficit.     Coordination: Coordination normal.  Psychiatric:     Comments: tearful        LABORATORY DATA:  I have reviewed the data as listed Lab Results  Component Value Date   WBC 8.6 10/06/2018   HGB 12.4 10/06/2018   HCT  39.5 10/06/2018   MCV 90.8 10/06/2018   PLT 265 10/06/2018   Recent Labs    10/06/18 1918 02/19/19 0925  NA 136  --   K 3.4*  --   CL 100  --   CO2 25  --   GLUCOSE 124*  --   BUN 15  --   CREATININE 0.61 0.60  CALCIUM 9.7  --   GFRNONAA >60  --   GFRAA >60  --   PROT 7.7  --   ALBUMIN 3.7  --   AST 24  --   ALT 29  --   ALKPHOS 46  --   BILITOT 0.7  --    Iron/TIBC/Ferritin/ %Sat No results found for: IRON, TIBC, FERRITIN, IRONPCTSAT    RADIOGRAPHIC STUDIES: I have personally reviewed the radiological images as listed and agreed with the findings in the report. MR LUMBAR SPINE WO CONTRAST  Result Date: 01/15/2019 CLINICAL DATA:  Low back and bilateral leg pain for 2 months. EXAM: MRI LUMBAR SPINE WITHOUT CONTRAST TECHNIQUE: Multiplanar, multisequence MR imaging of the lumbar spine was performed. No intravenous contrast was administered. COMPARISON:  CT abdomen and pelvis 10/07/2018. FINDINGS: Segmentation:  Standard. Alignment: There is 0.2 cm retrolisthesis L2 on L3, 0.3 cm anterolisthesis L4 on L5 and 0.4 cm anterolisthesis L5 on S1. Vertebrae:  No fracture, evidence of discitis, or bone lesion. Conus medullaris and cauda equina: Conus extends to the T12-L1 level. Conus and cauda equina appear normal. Paraspinal and other soft tissues: Negative. Disc levels: T10-11 and T11-12 are imaged in the sagittal plane only. There is a shallow disc bulge at T10-11. A central disc extrusion with cephalad extension is seen at T11-12. The central canal and foramina appear open at both levels. T12-L1: Shallow disc bulge and mild-to-moderate facet degenerative change. No stenosis L1-2: Mild-to-moderate facet arthropathy. There is a broad-based right  paracentral protrusion and mild central canal stenosis. The foramina remain open. L2-3: Broad-based disc bulge causes moderate central canal stenosis and narrowing in the subarticular recesses. There is also mild bilateral foraminal narrowing. L3-4:  Moderate facet arthropathy, ligamentum flavum thickening and a diffuse broad-based disc bulge. There is moderate to moderately severe central canal stenosis and right much worse than left subarticular recess narrowing. The left foramen is open. Mild right foraminal narrowing noted. L4-5: Advanced facet degenerative disease, bulky ligamentum flavum thickening and a large broad-based central protrusion cause severe central canal and bilateral subarticular recess stenosis. Mild bilateral foraminal narrowing is present. L5-S1: Advanced bilateral facet degenerative change. The right facets are ankylosed. The disc is uncovered with a shallow bulge but the central canal and right foramen are open. Mild to moderate left foraminal narrowing noted. IMPRESSION: 1. 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 2. Moderate to moderately severe central canal stenosis and right worse than left subarticular recess narrowing at L3-4 where there is also mild right foraminal narrowing. 3. Moderate central canal stenosis and narrowing in the subarticular recesses at L2-3 where there is a broad-based disc bulge. 4. Advanced facet degenerative change at L5-S1 where there is mild to moderate left foraminal narrowing. Electronically Signed   By: Inge Rise M.D.   On: 01/15/2019 15:13   MR BREAST BILATERAL W WO CONTRAST INC CAD  Result Date: 02/19/2019 CLINICAL DATA:  85 year old female with history of recurrent right breast cellulitis and nipple discharge over the past 2 years. History notes no pain or discharge at the time of examination. LABS:  None performed on site. EXAM: BILATERAL BREAST MRI WITH AND WITHOUT CONTRAST TECHNIQUE: Multiplanar, multisequence MR images of both breasts were obtained prior to and following the intravenous administration of 10 ml of Gadavist. Three-dimensional MR images were rendered by post-processing of the original MR data on an  independent workstation. The three-dimensional MR images were interpreted, and findings are reported in the following complete MRI report for this study. Three dimensional images were evaluated at the independent DynaCad workstation COMPARISON:  Previous exam(s). FINDINGS: Breast composition: b. Scattered fibroglandular tissue. Background parenchymal enhancement: Moderate. Right breast: The right breast appears slightly retracted compared to the left. There is non-mass enhancement involving the entire central aspect extending from the nipple posteriorly (series 12, image 38/112). There is mild associated nipple retraction and possible thickening of the periareolar skin. Overall enhancement measures 8.3 x 5.1 cm in axial dimensions. No dominant mass identified. Left breast: No suspicious mass or abnormal enhancement. Lymph nodes: No abnormal appearing lymph nodes. Ancillary findings:  None. IMPRESSION: 1. Indeterminate non-mass enhancement of the central right breast with associated retraction of the nipple and possible mild periareolar skin thickening. While these findings could potentially be post inflammatory in nature, given that the patient has no current clinical symptoms, recommendation is for definitive tissue diagnosis. 2. No MRI evidence of malignancy on the left. 3. No suspicious lymphadenopathy. RECOMMENDATION: Single area MRI guided biopsy of right breast non-mass enhancement. BI-RADS CATEGORY  4: Suspicious. Electronically Signed   By: Kristopher Oppenheim M.D.   On: 02/19/2019 11:38   MM DIAG BREAST TOMO UNI RIGHT  Result Date: 02/05/2019 CLINICAL DATA:  Six-month interval follow-up after treatment for RIGHT breast mastitis.Patient states that her symptoms have resolved. EXAM: DIGITAL DIAGNOSTIC UNILATERAL RIGHT MAMMOGRAM WITH CAD AND TOMO COMPARISON:  Previous exam(s). ACR Breast Density Category b: There are scattered areas of fibroglandular density. FINDINGS: Tomosynthesis and  synthesized full field CC  and MLO views of the RIGHT breast were obtained. No findings suspicious for malignancy in the RIGHT breast. The focal asymmetry involving OUTER breast has a similar appearance to multiple prior mammograms dating back to at least 2015. Mammographic images were processed with CAD. IMPRESSION: No mammographic evidence of malignancy involving the RIGHT breast. RECOMMENDATION: Annual BILATERAL screening mammography which is due in 6 months. I have discussed the findings and recommendations with the patient. If applicable, a reminder letter will be sent to the patient regarding the next appointment. BI-RADS CATEGORY  1: Negative. Electronically Signed   By: Evangeline Dakin M.D.   On: 02/05/2019 10:37   MM CLIP PLACEMENT RIGHT  Result Date: 03/09/2019 CLINICAL DATA:  MRI guided biopsy was performed non mass enhancement in the central right breast. EXAM: DIAGNOSTIC RIGHT MAMMOGRAM POST MRI BIOPSY COMPARISON:  Previous exam(s). FINDINGS: Mammographic images were obtained following MRI guided biopsy of the right breast. The biopsy marking clip is in expected position at the site of biopsy. Barbell shaped biopsy clip placed today. IMPRESSION: Appropriate positioning of the barbell shaped biopsy marking clip at the site of biopsy in the central right breast. Final Assessment: Post Procedure Mammograms for Marker Placement Electronically Signed   By: Curlene Dolphin M.D.   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 This was found to be concordant by Dr. Curlene Dolphin. Pathology results were discussed with the patient and her husband by telephone. The patient reported doing well after the biopsy with tenderness at the site. Post biopsy instructions and care were reviewed and questions were  answered. The patient was encouraged to call [facility] for any additional concerns. Patient is currently being followed by Dr. Earlie Server of [facility] and Dr. Herbert Pun of [facility] [place]. Tanya Nones Nurse Navigator at [facility] coordinated care for patient to see Dr. Tasia Catchings on 03/14/19 @ 9:00 and Dr. Windell Moment on 03/16/19 @ 10:45. Pathology results reported by Stacie Acres RN on 03/13/2019. Electronically Signed   By: Curlene Dolphin M.D.   On: 03/13/2019 13:41   Result Date: 03/13/2019 CLINICAL DATA:  MRI guided biopsy was recommended non mass enhancement in the central right breast. EXAM: MRI GUIDED CORE NEEDLE BIOPSY OF THE RIGHT BREAST TECHNIQUE: Multiplanar, multisequence MR imaging of the right breast was performed both before and after administration of intravenous contrast. CONTRAST:  65m GADAVIST GADOBUTROL 1 MMOL/ML IV SOLN COMPARISON:  Previous exams. FINDINGS: I met with the patient, and we discussed the procedure of MRI guided biopsy, including risks, benefits, and alternatives. Specifically, we discussed the risks of infection, bleeding, tissue injury, clip migration, and inadequate sampling. Informed, written consent was given. The usual time out protocol was performed immediately prior to the procedure. Using sterile technique, 1% Lidocaine, MRI guidance, and a 9 gauge vacuum assisted device, biopsy was performed of non mass enhancement in the central right breast using a lateral approach. At the conclusion of the procedure, a barbell tissue marker clip was deployed into the biopsy cavity. Follow-up 2-view mammogram was performed and dictated separately. IMPRESSION: MRI guided biopsy of right breast.  No apparent complications. Electronically Signed: By: SCurlene DolphinM.D. On: 03/09/2019 10:27  ASSESSMENT & PLAN:  1. Ductal carcinoma in situ (DCIS) of right breast    MRI images were independent  reviewed by me and discussed with patient and her daughter. Per daughter's request, I showed them the MRI images I have also discussed with Dr. Windell Moment after her last visit and prior to current visit. 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  Both the nipple-areolar complex and the underlying DCIS needed to be excised. Most patients will require a mastectomy. In selected cases, if nipple-areolar resection and wide local excision of the abnormality can be performed with an acceptable cosmetic result and negative margins, breast-conserving surgery followed by adjuvant radiation maybe an option.  Given the large size-8cm- and central location of non-mass enhancement, Dr.Cintron Ferrel Logan has recommended total mastectomy.   I recommend sentinel lymph node biopsy to be done preemptively in order to avoid complete axillary lymph node dissection in case an invasive component is identified at final pathology.  Patient became tearful and not able to continue further discussion with me.  I had a long discussion with daughter.  Daughter is interested about a second opinion at a tertiary center.  Dr. Windell Moment has started the process of exploring second opinion at [facility] or [place] if patient is interested.  I encourage patient to discuss with her family members and update me and Dr. Windell Moment for her decisions. I discussed with daughter that although less common in patients' age group, it is reasonable to have a discussion with plastic surgeon for eligibility of reconstruction, if her main concern is cosmetic appearance after mastectomy.  All questions were answered. The patient knows to call the clinic with any problems questions or concerns.   Return of visit to be determined.  We spent sufficient time to discuss many aspect of care, questions were answered to patient's satisfaction. A total of 40 minutes was spent on this visit.  With  30 minutes counseling the patient on the diagnosis, review  of image findings, surgery recommendation and possible treatment options.  Additional 10 minutes was spent on answering patient's questions.    Earlie Server, MD, PhD Hematology Oncology [facility] Pager- [phone] 03/20/2019

## 2019-03-22 ENCOUNTER — Encounter: Payer: Self-pay | Admitting: *Deleted

## 2019-03-22 NOTE — Progress Notes (Signed)
Per my discussion with Dr. Tasia Catchings, I called the patient to discuss her desire to have a second surgical opinion.  Patient had previously discussed this with Dr. Peyton Najjar.  Mary Mckay, Dr. Deniece Ree nurse and left a message for her to call me back.  Patient would like to go to [facility] for second opinion.  Patient requested that I notify her daughter Mary Mckay also.  Spoke to [place] said her mom was ok with [facility] or [facility].  Stated where she was able to get in the fastest.  Mary Mckay stated that [name] was working on this.  Informed Mary Mckay I would talk to [facility] and have her contact her.

## 2019-03-26 ENCOUNTER — Encounter: Payer: Self-pay | Admitting: *Deleted

## 2019-03-26 NOTE — Progress Notes (Signed)
Patient's daughter Ermalinda Barrios called to see if I knew when her mom's appointment at [facility] is going to be.  Dr.Cintron's office made the referral.  Called his office, but his nurse [name] is off today.  The secretary was not aware of the referral appointment date.  Called [place] back and informed her to call [name] again tomorrow.

## 2019-04-06 ENCOUNTER — Encounter: Payer: Self-pay | Admitting: *Deleted

## 2019-04-06 NOTE — Progress Notes (Signed)
Called and spoke to patient's daughter Ermalinda Barrios.  Patient does have appointment for second opinion at [facility] on 04/13/19.  She is to call if she has questions or needs.

## 2019-04-26 ENCOUNTER — Encounter: Payer: Self-pay | Admitting: *Deleted

## 2019-05-03 ENCOUNTER — Telehealth: Payer: Self-pay

## 2019-05-03 NOTE — Telephone Encounter (Signed)
error 

## 2019-07-18 ENCOUNTER — Emergency Department
Admission: EM | Admit: 2019-07-18 | Discharge: 2019-07-18 | Disposition: A | Discharge status: Home / Self Care | Payer: Medicare Other | Attending: Emergency Medicine | Admitting: Emergency Medicine

## 2019-07-18 ENCOUNTER — Emergency Department: Payer: Medicare Other

## 2019-07-18 ENCOUNTER — Encounter: Payer: Self-pay | Admitting: Emergency Medicine

## 2019-07-18 ENCOUNTER — Other Ambulatory Visit: Payer: Self-pay

## 2019-07-18 DIAGNOSIS — Z853 Personal history of malignant neoplasm of breast: Secondary | ICD-10-CM | POA: Diagnosis not present

## 2019-07-18 DIAGNOSIS — Z79899 Other long term (current) drug therapy: Secondary | ICD-10-CM | POA: Diagnosis not present

## 2019-07-18 DIAGNOSIS — M79604 Pain in right leg: Secondary | ICD-10-CM

## 2019-07-18 DIAGNOSIS — I10 Essential (primary) hypertension: Secondary | ICD-10-CM | POA: Diagnosis not present

## 2019-07-18 HISTORY — DX: Malignant (primary) neoplasm, unspecified: C80.1

## 2019-07-18 IMAGING — US US EXTREM LOW VENOUS*R*
1 series · 14 of 24 positions shown · non-contrast
Comparison: None.

CLINICAL DATA: Right calf pain and swelling for 2 days, history of
breast cancer

EXAM:
RIGHT LOWER EXTREMITY VENOUS DOPPLER ULTRASOUND
TECHNIQUE: Gray-scale sonography with compression, as well as color and duplex
ultrasound, were performed to evaluate the deep venous system(s)
from the level of the common femoral vein through the popliteal and
proximal calf veins.

[Series 1: us venous img lower uni right (dvt) · portal-venous · 14 of 44 slices shown]
[im 1/44]
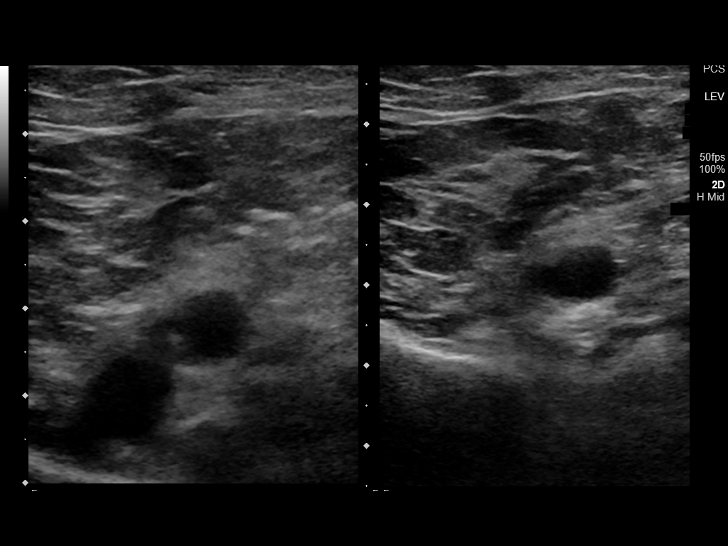
[im 4/44]
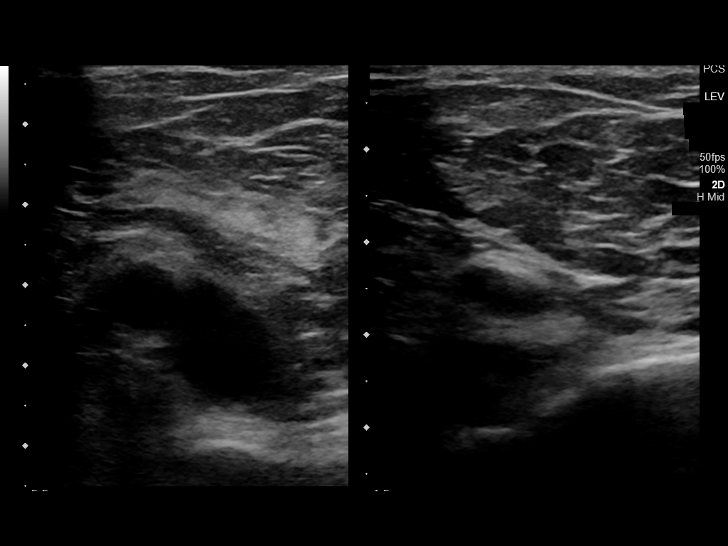
[im 8/44]
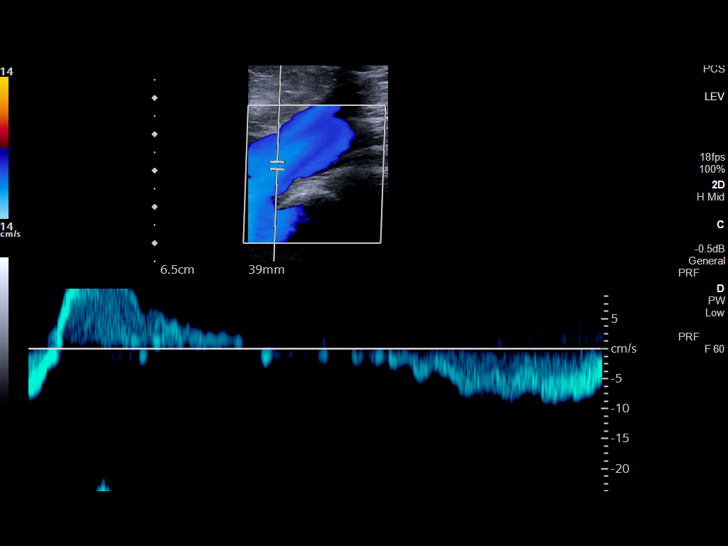
[im 12/44]
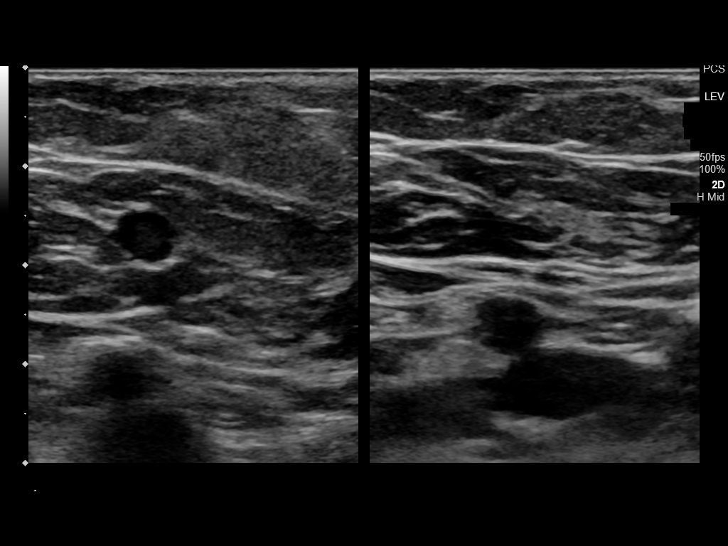
[im 14/44]
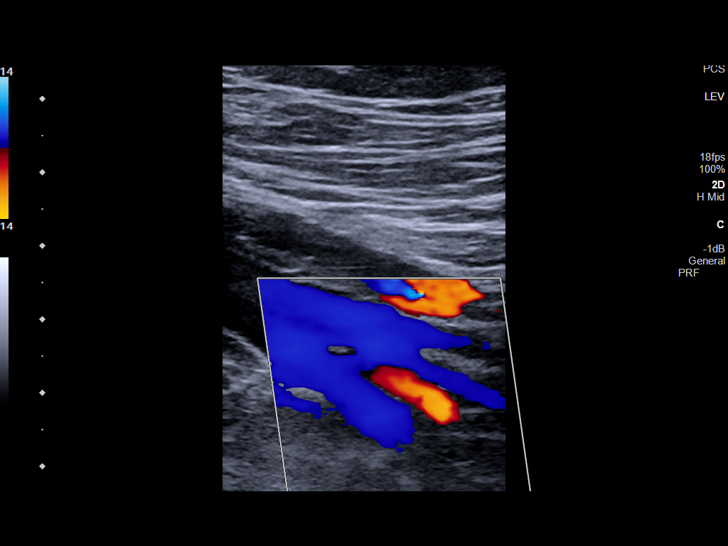
[im 17/44]
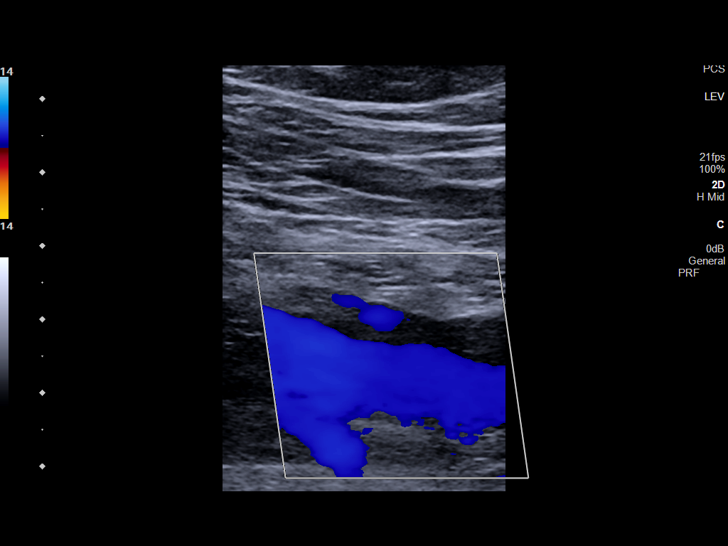
[im 21/44]
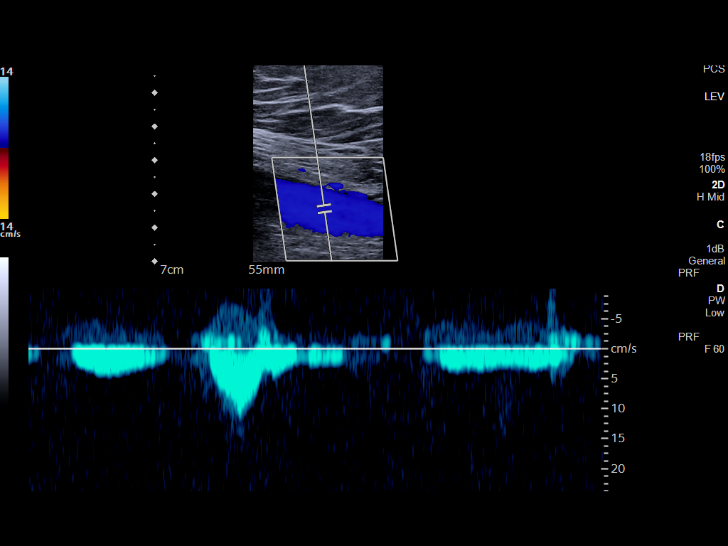
[im 23/44]
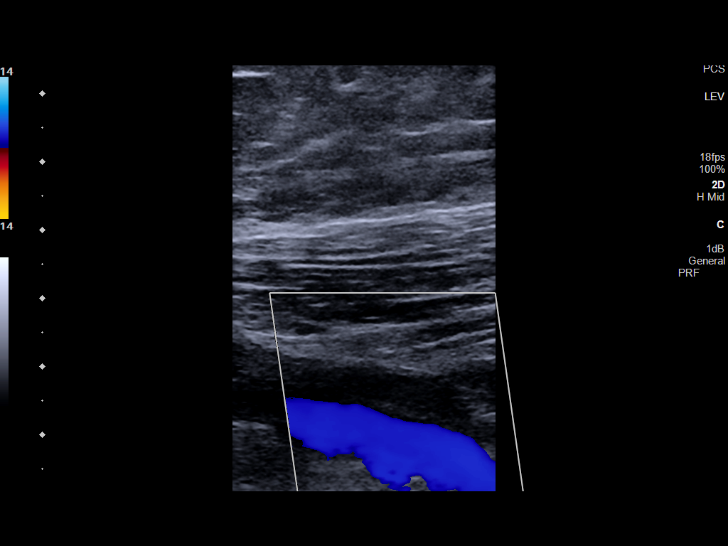
[im 27/44]
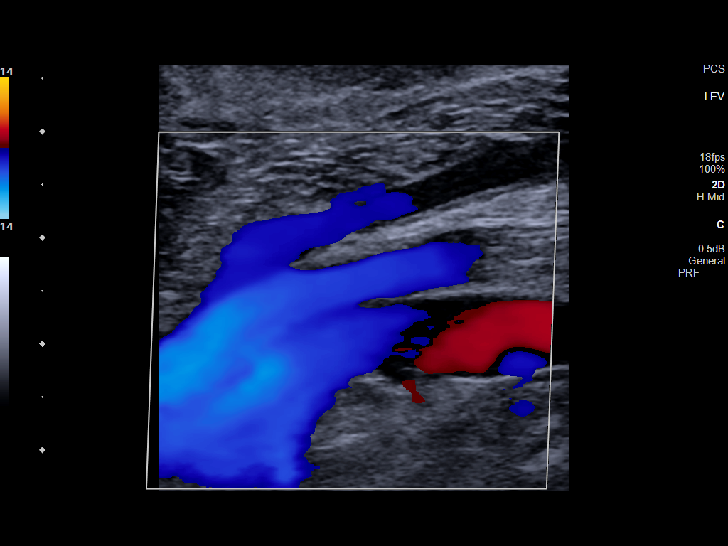
[im 30/44]
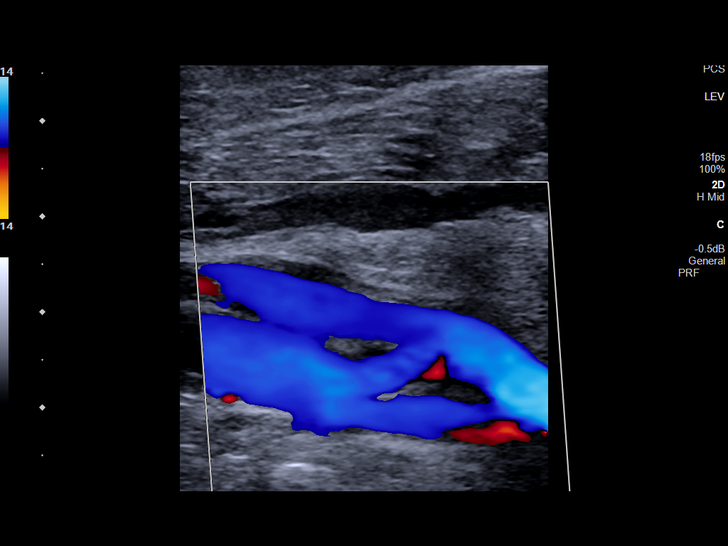
[im 34/44]
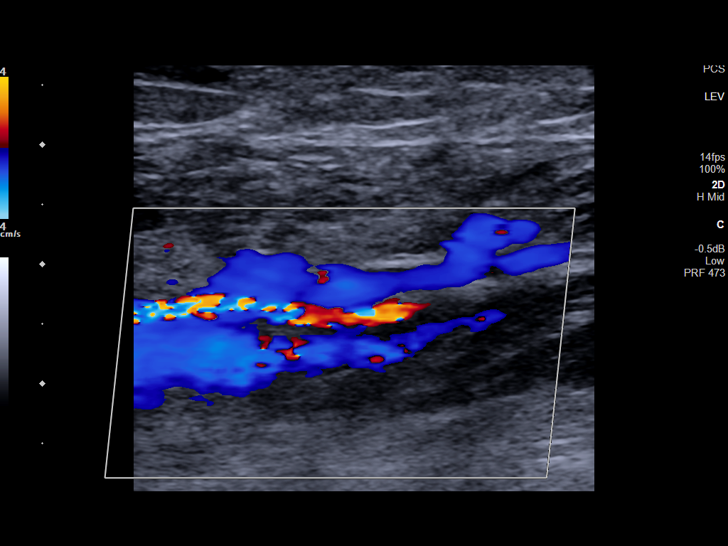
[im 36/44]
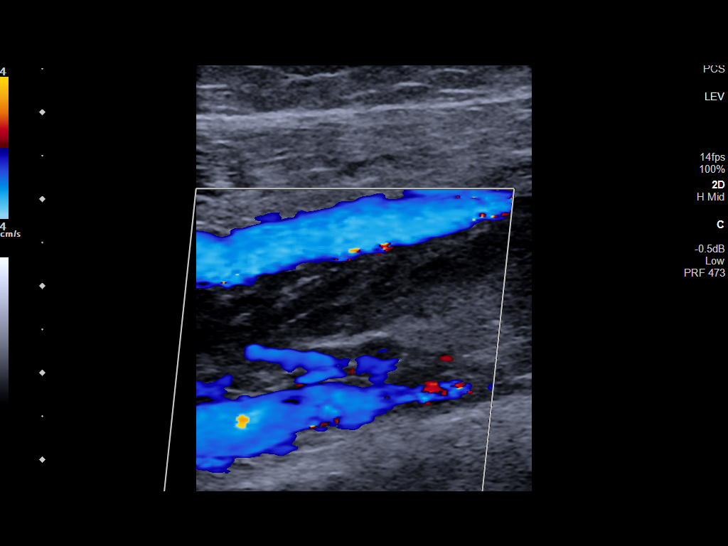
[im 40/44]
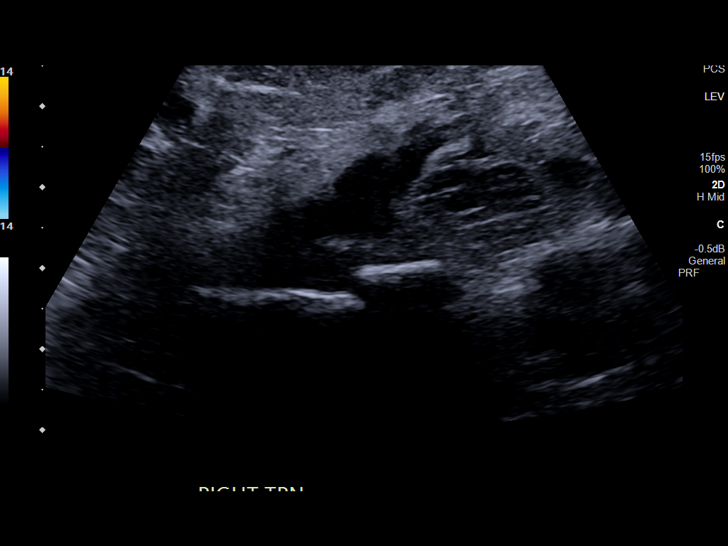
[im 44/44]
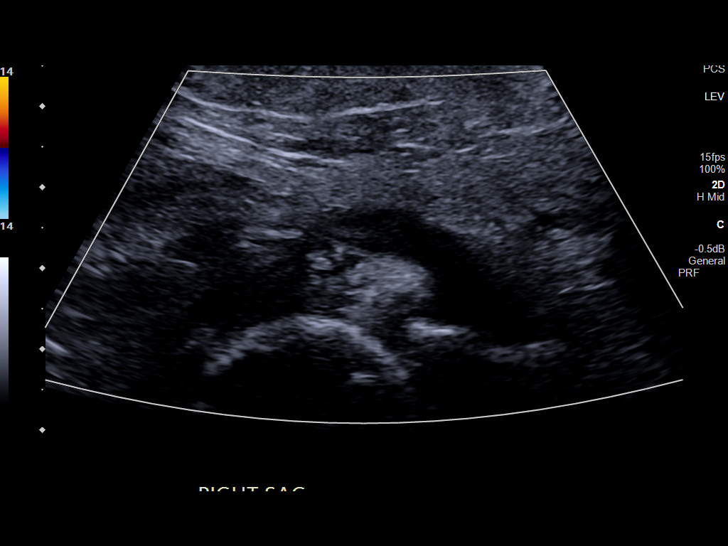

[14 of 24 positions shown; findings below may reference images not displayed]

FINDINGS: VENOUS

Normal compressibility of the common femoral, superficial femoral,
and popliteal veins, as well as the visualized calf veins.
Visualized portions of profunda femoral vein and great saphenous
vein unremarkable. No filling defects to suggest DVT on grayscale or
color Doppler imaging. Doppler waveforms show normal direction of
venous flow, normal respiratory plasticity and response to
augmentation.

Limited views of the contralateral common femoral vein are
unremarkable.

OTHER

Right Baker's cyst is identified measuring 4.0 x 1.2 x 5.3 cm.

Limitations: none
IMPRESSION: 1. No evidence of deep venous thrombosis within the right lower
extremity.
2. Right Baker cyst.

## 2019-07-18 MED ORDER — BACLOFEN 10 MG PO TABS
5.0000 mg | ORAL_TABLET | Freq: Once | ORAL | Status: AC
  Administered 2019-07-18: 5 mg via ORAL
  Filled 2019-07-18 (×2): qty 0.5

## 2019-07-18 NOTE — Discharge Instructions (Addendum)
Please seek medical attention for any high fevers, chest pain, shortness of breath, change in behavior, persistent vomiting, bloody stool or any other new or concerning symptoms.  

## 2019-07-18 NOTE — ED Provider Notes (Signed)
[facility] Emergency Department Provider Note   ____________________________________________   I have reviewed the triage vital signs and the nursing notes.   HISTORY  Chief Complaint Leg Pain   History limited by: Not Limited   HPI Mary Mckay is a 85 y.o. female who presents to the emergency department today because of concerns for right leg swelling and discomfort.  The patient states that she has had pain in her right ankle and lower calf for a long time.  She states she is seeing her doctor for this.  Has tried compression stockings as well as pain creams and muscle relaxers with varying results.  She states for the past 2 days however the discomfort seems to be worse.  She denies any traumatic injuries.   Records reviewed. Per medical record review patient has a history of polymyalgia rheumatica syndrome and right leg pain.  Past Medical History:  Diagnosis Date  . Allergic state   . Arthritis   . 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 Cataract cortical, senile   . Diverticulitis   . Glaucoma   . Hypertension   . Osteoporosis     There are no problems to display for this patient.   Past Surgical History:  Procedure Laterality Date  . ABDOMINAL HYSTERECTOMY    . BLADDER SURGERY    . BREAST BIOPSY Right early 2000s   benign  . COLONOSCOPY WITH PROPOFOL N/A 03/25/2017   Procedure: COLONOSCOPY WITH PROPOFOL;  Surgeon: [name], MD;  Location: [facility] ENDOSCOPY;  Service: Endoscopy;  Laterality: N/A;  . WRIST SURGERY     Right     Prior to Admission medications   Medication Sig Start Date End Date Taking? Authorizing Provider  atenolol (TENORMIN) 100 MG tablet Take 100 mg daily by mouth.    [provider]  candesartan (ATACAND) 32 MG tablet Take 32 mg daily by mouth.    [provider]  fluticasone (VERAMYST) 27.5 MCG/SPRAY nasal spray Place 2 sprays daily into the nose.    [provider]   gabapentin (NEURONTIN) 100 MG capsule 1 po qHS 02/21/19   [provider]  hydrochlorothiazide (HYDRODIURIL) 25 MG tablet Take 25 mg daily by mouth.    [provider]  latanoprost (XALATAN) 0.005 % ophthalmic solution Place 1 drop into both eyes at bedtime.    [provider]  linagliptin (TRADJENTA) 5 MG TABS tablet Take 5 mg daily by mouth.    [provider]  NIFEdipine (PROCARDIA-XL/ADALAT CC) 30 MG 24 hr tablet Take 30 mg daily by mouth.    [provider]  OMEGA 3-6-9 FATTY ACIDS PO Take 1,200 mg by mouth daily.    [provider]  potassium chloride (K-DUR,KLOR-CON) 10 MEQ tablet Take 10 mEq by mouth 2 (two) times daily.    [provider]  predniSONE (DELTASONE) 5 MG tablet Take 5 mg by mouth. Tapering down.  Now taking 2.5 tabs QD 01/03/19   [provider]  psyllium (METAMUCIL) 58.6 % packet Take 1 packet by mouth daily.    [provider]  traMADol (ULTRAM) 50 MG tablet Take 50 mg every 8 (eight) hours as needed by mouth.    [provider]    Allergies Fentanyl and Meloxicam  Family History  Problem Relation Age of Onset  . Osteoporosis Mother   . Stroke Mother   . Heart attack Father   . Alcohol abuse Sister   .  Kidney disease Sister   . Allergic rhinitis Sister   . Diabetes Sister   . Heart attack Sister   . Stroke Sister   . Hypertension Sister   . Breast cancer Sister 38  . Diabetes Brother   . Heart attack Brother   . Hypertension Brother     Social History Social History   Tobacco Use  . Smoking status: Never Smoker  . Smokeless tobacco: Never Used  Vaping Use  . Vaping Use: Never used  Substance Use Topics  . Alcohol use: Not Currently  . Drug use: Never    Review of Systems Constitutional: No fever/chills Eyes: No visual changes. ENT: No sore throat. Cardiovascular: Denies chest pain. Respiratory: Denies shortness of breath. Gastrointestinal: No abdominal  pain.  No nausea, no vomiting.  No diarrhea.   Genitourinary: Negative for dysuria. Musculoskeletal: Positive for right leg pain and swelling.  Skin: Negative for rash. Neurological: Negative for headaches, focal weakness or numbness.  ____________________________________________   PHYSICAL EXAM:  VITAL SIGNS: ED Triage Vitals  Enc Vitals Group     BP 07/18/19 1638 (!) 155/72     Pulse Rate 07/18/19 1638 67     Resp 07/18/19 1638 18     Temp 07/18/19 1638 98.2 F (36.8 C)     Temp Source 07/18/19 1638 Oral     SpO2 07/18/19 1638 98 %     Weight 07/18/19 1638 180 lb (81.6 kg)     Height 07/18/19 1638 5' (1.524 m)     Head Circumference --      Peak Flow --      Pain Score 07/18/19 1640 10   Constitutional: Alert and oriented.  Eyes: Conjunctivae are normal.  ENT      Head: Normocephalic and atraumatic.      Nose: No congestion/rhinnorhea.      Mouth/Throat: Mucous membranes are moist.      Neck: No stridor. Hematological/Lymphatic/Immunilogical: No cervical lymphadenopathy. Cardiovascular: Normal rate, regular rhythm.  No murmurs, rubs, or gallops.  Respiratory: Normal respiratory effort without tachypnea nor retractions. Breath sounds are clear and equal bilaterally. No wheezes/rales/rhonchi. Gastrointestinal: Soft and non tender. No rebound. No guarding.  Genitourinary: Deferred Musculoskeletal: Minimal swelling noted to bilateral lower extremities. No erythema or warmth appreciated. DP 2+.  Neurologic:  Normal speech and language. No gross focal neurologic deficits are appreciated.  Skin:  Skin is warm, dry and intact. No rash noted. Psychiatric: Mood and affect are normal. Speech and behavior are normal. Patient exhibits appropriate insight and judgment.  ____________________________________________    LABS (pertinent positives/negatives)  None  ____________________________________________   EKG  None  ____________________________________________     RADIOLOGY  [place] right lower leg No DVT. Bakers cyst  ____________________________________________   PROCEDURES  Procedures  ____________________________________________   INITIAL IMPRESSION / ASSESSMENT AND PLAN / ED COURSE  Pertinent labs & imaging results that were available during my care of the patient were reviewed by me and considered in my medical decision making (see chart for details).   Patient presents to the emergency department today with concerns for acute on chronic right leg pain.  Ultrasound was performed which did not show any blood clots.  On exam patient has minimal swelling although otherwise no tenderness.  Dorsalis pedis 2+.  No erythema.  At this time I doubt arterial block or cellulitis.  Given chronic nature of pain discussed with patient importance of continued follow-up with outpatient providers.  ____________________________________________   FINAL CLINICAL IMPRESSION(S) / ED DIAGNOSES  Final diagnoses:  Right leg pain     Note: This dictation was prepared with Dragon dictation. Any transcriptional errors that result from this process are unintentional     Nance Pear, MD 07/18/19 2153

## 2019-07-18 NOTE — ED Notes (Signed)
Pt ambulated to the bathroom with this tech.

## 2019-07-18 NOTE — ED Triage Notes (Signed)
Pt in via [facility] with c/o leg pain for 2 days. Pt with hx of the same but went away until today. Pain from mid calf to foot.

## 2019-07-18 NOTE — ED Triage Notes (Signed)
Pt presents to ED via [facility] with R lower leg pain x 2 days. Pt states pain from mid lower calf to her toes with swelling.   158/54 66Hr 16RR 99% RA 109 CBG

## 2019-12-10 ENCOUNTER — Ambulatory Visit
Admission: EM | Admit: 2019-12-10 | Discharge: 2019-12-10 | Disposition: A | Discharge status: Home / Self Care | Payer: Medicare Other | Attending: Family Medicine | Admitting: Family Medicine

## 2019-12-10 ENCOUNTER — Encounter: Payer: Self-pay | Admitting: Emergency Medicine

## 2019-12-10 ENCOUNTER — Other Ambulatory Visit: Payer: Self-pay

## 2019-12-10 DIAGNOSIS — N644 Mastodynia: Secondary | ICD-10-CM

## 2019-12-10 DIAGNOSIS — N6452 Nipple discharge: Secondary | ICD-10-CM

## 2019-12-10 MED ORDER — CEPHALEXIN 500 MG PO CAPS: 500.0000 mg | ORAL_CAPSULE | Freq: Four times a day (QID) | ORAL | 0 refills | Status: DC

## 2019-12-10 NOTE — ED Provider Notes (Signed)
[facility]    CSN: [id number] Arrival date & time: 12/10/19  4970      History   Chief Complaint Chief Complaint  Patient presents with  . Breast Pain    HPI Mary Mckay is a 85 y.o. female.   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  She has had a right breast drainage, pain since yesterday evening.  There is some redness, swelling to the breast with inverted nipple.  Not currently undergoing any treatment for her breast cancer.  Low-grade fever today.  Describes as greenish/bloody discharge.      Past Medical History:  Diagnosis Date  . Allergic state   . Arthritis   . 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 Cataract cortical, senile   . Diverticulitis   . Glaucoma   . Hypertension   . Osteoporosis     There are no problems to display for this patient.   Past Surgical History:  Procedure Laterality Date  . ABDOMINAL HYSTERECTOMY    . BLADDER SURGERY    . BREAST BIOPSY Right early 2000s   benign  . COLONOSCOPY WITH PROPOFOL N/A 03/25/2017   Procedure: COLONOSCOPY WITH PROPOFOL;  Surgeon: [name], MD;  Location: [facility] ENDOSCOPY;  Service: Endoscopy;  Laterality: N/A;  . WRIST SURGERY     Right     OB History    Gravida  7   Para      Term      Preterm      AB      Living  6     SAB      TAB      Ectopic      Multiple      Live Births               Home Medications    Prior to Admission medications   Medication Sig Start Date End Date Taking? Authorizing Provider  atenolol (TENORMIN) 100 MG tablet Take 100 mg daily by mouth.   Yes [provider]  candesartan (ATACAND) 32 MG tablet Take 32 mg daily by mouth.   Yes [provider]  gabapentin (NEURONTIN) 100 MG capsule 1 po qHS 02/21/19  Yes [provider]  hydrochlorothiazide (HYDRODIURIL) 25 MG tablet Take 25 mg daily by mouth.   Yes [provider]  linagliptin (TRADJENTA) 5 MG TABS tablet Take 5 mg  daily by mouth.   Yes [provider]  potassium chloride (K-DUR,KLOR-CON) 10 MEQ tablet Take 10 mEq by mouth 2 (two) times daily.   Yes [provider]  cephALEXin (KEFLEX) 500 MG capsule Take 1 capsule (500 mg total) by mouth 4 (four) times daily. 12/10/19   Bennie Scaff, Tressia Miners A, NP  fluticasone (VERAMYST) 27.5 MCG/SPRAY nasal spray Place 2 sprays daily into the nose.    [provider]  latanoprost (XALATAN) 0.005 % ophthalmic solution Place 1 drop into both eyes at bedtime.    [provider]  NIFEdipine (PROCARDIA-XL/ADALAT CC) 30 MG 24 hr tablet Take 30 mg daily by mouth.    [provider]  OMEGA 3-6-9 FATTY ACIDS PO Take 1,200 mg by mouth daily.    [provider]  predniSONE (DELTASONE) 5 MG tablet Take 5 mg by mouth. Tapering down.  Now taking 2.5 tabs QD 01/03/19   [provider]  psyllium (METAMUCIL) 58.6 % packet Take 1 packet by mouth daily.    [provider]  traMADol (ULTRAM) 50 MG tablet Take 50 mg every 8 (eight) hours as needed by mouth.    [provider]    Family History Family History  Problem Relation Age of Onset  . Osteoporosis Mother   . Stroke Mother   . Heart attack Father   . Alcohol abuse Sister   . Kidney disease Sister   . Allergic rhinitis Sister   . Diabetes Sister   . Heart attack Sister   . Stroke Sister   . Hypertension Sister   . Breast cancer Sister 57  . Diabetes Brother   . Heart attack Brother   . Hypertension Brother     Social History Social History   Tobacco Use  . Smoking status: Never Smoker  . Smokeless tobacco: Never Used  Vaping Use  . Vaping Use: Never used  Substance Use Topics  . Alcohol use: Not Currently  . Drug use: Never     Allergies   Fentanyl and Meloxicam   Review of Systems Review of Systems   Physical Exam Triage Vital Signs ED Triage Vitals  Enc Vitals Group     BP 12/10/19 0914 123/81     Pulse Rate 12/10/19 0914 64      Resp 12/10/19 0914 17     Temp 12/10/19 0914 99.5 F (37.5 C)     Temp Source 12/10/19 0914 Oral     SpO2 12/10/19 0914 94 %     Weight 12/10/19 0906 189 lb (85.7 kg)     Height 12/10/19 0906 5' (1.524 m)     Head Circumference --      Peak Flow --      Pain Score 12/10/19 0905 6     Pain Loc --      Pain Edu? --      Excl. in [place]? --    No data found.  Updated Vital Signs BP 123/81 (BP Location: Left Arm)   Pulse 64   Temp 99.5 F (37.5 C) (Oral)   Resp 17   Ht 5' (1.524 m)   Wt 189 lb (85.7 kg)   SpO2 94%   BMI 36.91 kg/m   Visual Acuity Right Eye Distance:   Left Eye Distance:   Bilateral Distance:    Right Eye Near:   Left Eye Near:    Bilateral Near:     Physical Exam Vitals and nursing note reviewed.  Constitutional:      General: She is not in acute distress.    Appearance: Normal appearance. She is not ill-appearing, toxic-appearing or diaphoretic.  HENT:     Head: Normocephalic.     Nose: Nose normal.     Mouth/Throat:     Pharynx: Oropharynx is clear.  Eyes:     Conjunctiva/sclera: Conjunctivae normal.  Pulmonary:     Effort: Pulmonary effort is normal.  Chest:     Breasts:        Right: Swelling, inverted nipple, nipple discharge, skin change and tenderness present.   Musculoskeletal:        General: Normal range of motion.     Cervical back: Normal range of motion.  Skin:    General: Skin is warm and dry.     Findings: No rash.  Neurological:     Mental Status: She is alert.  Psychiatric:        Mood and Affect: Mood normal.      UC Treatments / Results  Labs (all labs ordered are listed, but  only abnormal results are displayed) Labs Reviewed - No data to display  EKG   Radiology No results found.  Procedures Procedures (including critical care time)  Medications Ordered in UC Medications - No data to display  Initial Impression / Assessment and Plan / UC Course  I have reviewed the triage vital signs and the nursing  notes.  Pertinent labs & imaging results that were available during my care of the patient were reviewed by me and considered in my medical decision making (see chart for details).     Nipple discharge and breast pain Patient with history of ductal carcinoma, not currently undergoing any treatment.  Appears breast may be infected today.  There is increased warmth, swelling, redness and drainage from the nipple. We will go ahead and start on antibiotics and have her follow-up with her doctor  Final Clinical Impressions(s) / UC Diagnoses   Final diagnoses:  Nipple discharge  Breast pain     Discharge Instructions     Take the antibiotics as prescribed  Follow up with your doctor.     ED Prescriptions    Medication Sig Dispense Auth. Provider   cephALEXin (KEFLEX) 500 MG capsule Take 1 capsule (500 mg total) by mouth 4 (four) times daily. 28 capsule Bethani Brugger A, NP     PDMP not reviewed this encounter.   Orvan July, NP 12/10/19 [phone]

## 2019-12-10 NOTE — Discharge Instructions (Addendum)
Take the antibiotics as prescribed  Follow up with your doctor.

## 2019-12-10 NOTE — ED Triage Notes (Signed)
Patient c/o RT breast drainage since yesterday evening.   Patient endorses pain in breast.   Patient denies fever at home.   Patient endorses a "greenish to maybe blood" drainage coming from breast.   History of DCIS.

## 2020-03-04 DEATH — deceased
